# Patient Record
Sex: Female | Born: 2005
Health system: Southern US, Community
[De-identification: ages and names within clinical notes are randomized; demographics above are authoritative.]

## PROBLEM LIST (undated history)

## (undated) DIAGNOSIS — F909 Attention-deficit hyperactivity disorder, unspecified type: Secondary | ICD-10-CM

## (undated) DIAGNOSIS — S62511A Displaced fracture of proximal phalanx of right thumb, initial encounter for closed fracture: Secondary | ICD-10-CM

## (undated) DIAGNOSIS — T7840XA Allergy, unspecified, initial encounter: Secondary | ICD-10-CM

## (undated) HISTORY — DX: Allergy, unspecified, initial encounter: T78.40XA

## (undated) HISTORY — DX: Displaced fracture of proximal phalanx of right thumb, initial encounter for closed fracture: S62.511A

## (undated) HISTORY — PX: WISDOM TOOTH EXTRACTION: SHX21

---
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
## 2024-02-22 ENCOUNTER — Ambulatory Visit (INDEPENDENT_AMBULATORY_CARE_PROVIDER_SITE_OTHER): Payer: Medicaid Other | Admitting: Family Medicine

## 2024-02-22 VITALS — BP 118/76 | Ht 64.0 in | Wt 179.0 lb

## 2024-02-22 DIAGNOSIS — M24412 Recurrent dislocation, left shoulder: Secondary | ICD-10-CM | POA: Diagnosis not present

## 2024-02-23 ENCOUNTER — Ambulatory Visit: Payer: BC Managed Care – PPO | Admitting: Family Medicine

## 2024-02-24 ENCOUNTER — Encounter: Payer: Self-pay | Admitting: Family Medicine

## 2024-02-24 NOTE — Progress Notes (Signed)
 PCP: Theadore Nan, MD  Chief Complaint: Left shoulder pain Subjective:   HPI: Patient is a 18 y.o. female here for for follow-up of her left shoulder pain.  Patient states that a few years ago she had a possible dislocation of her left shoulder while playing basketball.  Patient was diagnosed with a subluxation of the left shoulder at that time.  Patient states that since then she has had multiple episodes of where she feels like her shoulder goes in and out of place.  Patient states most recently she was playing flag football and felt her shoulder pull and had severe pain.  Patient had to come out of the game and was placed in a sling by a trainer on the side line.  Patient notes that the other day she woke up in the morning and placed her left hand behind her back while she was getting out of bed and also felt like her shoulder slipped. Patient is currently using a sling and notes significant pain with any movement.  Patient has been doing Tylenol for pain.  Past Medical History:  Diagnosis Date   ADHD (attention deficit hyperactivity disorder)    Allergy    Phreesia 05/08/2021   Closed fracture of base of proximal phalanx of right thumb 06/04/2015   Salter-Harris type II Ortho: Dairl Ponder [facility])    Nocturnal enuresis 03/13/2014   Shoulder subluxation, left, initial encounter 01/12/2022   Urinary urgency 08/14/2015    Current Outpatient Medications on File Prior to Visit  Medication Sig Dispense Refill   buPROPion (WELLBUTRIN XL) 300 MG 24 hr tablet Take 300 mg by mouth at bedtime. (Patient not taking: Reported on 12/09/2023)     cetirizine (ZYRTEC) 10 MG tablet Take 1 tablet (10 mg total) by mouth daily. 30 tablet 11   fluticasone (FLONASE) 50 MCG/ACT nasal spray Place 1 spray into both nostrils daily as needed for allergies or rhinitis. 16 mL 3   guanFACINE (INTUNIV) 4 MG TB24 ER tablet Take 4 mg by mouth every morning.     hydrocortisone 2.5 % cream Mixed 1:1 with  Eucerin Cream by pharmacy. Use daily PRN dry skin. 424 g 5   hydrOXYzine (ATARAX) 50 MG tablet Take 50 mg by mouth daily as needed.     polyethylene glycol powder (GLYCOLAX/MIRALAX) 17 GM/SCOOP powder Take one capful in 8 ounces once a day as needed 225 g 5   RETIN-A 0.01 % gel Apply topically at bedtime. Apply moisturizing cream afterwards. Use sunscreen while using this product. (Patient not taking: Reported on 12/09/2023) 45 g 5   REXULTI 3 MG TABS Take 1 tablet by mouth daily. (Patient not taking: Reported on 12/09/2023)     No current facility-administered medications on file prior to visit.    No past surgical history on file.  Allergies  Allergen Reactions   Other Itching    Seasonal     BP 118/76   Ht 5\' 4"  (1.626 m)   Wt 179 lb (81.2 kg)   BMI 30.73 kg/m       No data to display             01/12/2022    3:41 PM 02/18/2022    8:36 AM  [facility]olescent [facility]  Frequency of at least 60 minutes physical activity (# days/week) 5 5        Objective:  Physical Exam:  Gen: NAD, comfortable in exam room  Left shoulder: Inspection reveals no gross  abnormality of the left shoulder, there is tenderness to palpation throughout the shoulder over the humeral head on the anterior lateral and posterior aspect.  There is also tenderness down the biceps.  Range of motion is decreased due to significant amount of pain, noted instability with shoulder abduction between 20 to 40 degrees.  Did not attempt any further due to pain.  Strength is decreased significantly of the left shoulder.   Assessment & Plan:  1. 1. Recurrent subluxation of left shoulder (Primary) -Discussed with patient that given her symptoms as well as her physical exam and history, we will go ahead and order an MRI of the shoulder right now for shoulder instability concerning for labral tear.  Patient may be having recurrent subluxations due to the tear.  Will go ahead and order MRI. -In  the meantime, patient was advised to only use the sling if she plans on walking long distances otherwise would prefer her to be out of the sling while at home.  Patient is accompanied by her mother who states that she is home for the week and will be resting.  Follow-up pending MRI. - MR SHOULDER LEFT W CONTRAST; Future - DG FLUORO GUIDED NEEDLE PLC ASPIRATION/INJECTION LOC; Future    Brenton Grills MD, PGY-4  Sports Medicine Fellow [facility]

## 2024-03-13 ENCOUNTER — Ambulatory Visit
Admission: RE | Admit: 2024-03-13 | Discharge: 2024-03-13 | Disposition: A | Discharge status: Home / Self Care | Payer: Medicaid Other | Source: Ambulatory Visit | Attending: Family Medicine

## 2024-03-13 ENCOUNTER — Ambulatory Visit
Admission: RE | Admit: 2024-03-13 | Discharge: 2024-03-13 | Disposition: A | Discharge status: Home / Self Care | Payer: Medicaid Other | Source: Ambulatory Visit | Attending: Family Medicine | Admitting: Family Medicine

## 2024-03-13 DIAGNOSIS — M24412 Recurrent dislocation, left shoulder: Secondary | ICD-10-CM

## 2024-03-13 MED ORDER — IOPAMIDOL (ISOVUE-M 200) INJECTION 41%
10.0000 mL | Freq: Once | INTRAMUSCULAR | Status: AC
Start: 2024-03-13 — End: 2024-03-13
  Administered 2024-03-13: 10 mL via INTRA_ARTICULAR

## 2024-03-20 NOTE — Addendum Note (Signed)
 Addended by: Brenton Grills on: 03/20/2024 01:59 PM   Modules accepted: Orders

## 2024-03-31 ENCOUNTER — Telehealth (HOSPITAL_BASED_OUTPATIENT_CLINIC_OR_DEPARTMENT_OTHER): Payer: Self-pay | Admitting: Orthopaedic Surgery

## 2024-03-31 ENCOUNTER — Ambulatory Visit (HOSPITAL_BASED_OUTPATIENT_CLINIC_OR_DEPARTMENT_OTHER): Payer: Self-pay | Admitting: Orthopaedic Surgery

## 2024-03-31 ENCOUNTER — Other Ambulatory Visit (HOSPITAL_BASED_OUTPATIENT_CLINIC_OR_DEPARTMENT_OTHER): Payer: Self-pay | Admitting: Orthopaedic Surgery

## 2024-03-31 ENCOUNTER — Ambulatory Visit (HOSPITAL_BASED_OUTPATIENT_CLINIC_OR_DEPARTMENT_OTHER): Admitting: Orthopaedic Surgery

## 2024-03-31 DIAGNOSIS — M25312 Other instability, left shoulder: Secondary | ICD-10-CM | POA: Diagnosis not present

## 2024-03-31 NOTE — Progress Notes (Signed)
 Chief Complaint: Left shoulder instability     History of Present Illness:    Marissa [name] is a 18 y.o. female left-hand-dominant female presents with left shoulder pain after approximately 10 dislocations.  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  She is here today for discussion of her recurrent shoulder instability.  She has trialed bracing in the past.  She has tried physical therapy for strengthening but despite this has had recurrent episodes.  She has not had approximately 10 episodes of either location or subluxation.  She has never required a formal reduction.  She is here today for MRI discussion.    PMH/PSH/Family History/Social History/Meds/Allergies:    Past Medical History:  Diagnosis Date   ADHD (attention deficit hyperactivity disorder)    Allergy    Phreesia 05/08/2021   Closed fracture of base of proximal phalanx of right thumb 06/04/2015   Salter-Harris type II Ortho: Dairl Ponder [facility])    Nocturnal enuresis 03/13/2014   Shoulder subluxation, left, initial encounter 01/12/2022   Urinary urgency 08/14/2015   No past surgical history on file. Social History   Socioeconomic History   Marital status: Single    Spouse name: Not on file   Number of children: Not on file   Years of education: Not on file   Highest education level: Not on file  Occupational History   Not on file  Tobacco Use   Smoking status: Never    Passive exposure: Never   Smokeless tobacco: Never  Substance and Sexual Activity   Alcohol use: Not on file   Drug use: Not on file   Sexual activity: Not on file  Other Topics Concern   Not on file  Social History Narrative   11th grade [employer] 22-23 school year.    Adopted at a young age   Social Drivers of Corporate investment banker Strain: Not on file  Food Insecurity: No Food Insecurity (08/17/2022)   Hunger Vital Sign    Worried About Running Out of Food in the Last Year: Never  true    Ran Out of Food in the Last Year: Never true  Transportation Needs: Not on file  Physical Activity: Not on file  Stress: Not on file  Social Connections: Not on file   No family history on file. Allergies  Allergen Reactions   Other Itching    Seasonal    Current Outpatient Medications  Medication Sig Dispense Refill   buPROPion (WELLBUTRIN XL) 300 MG 24 hr tablet Take 300 mg by mouth at bedtime. (Patient not taking: Reported on 12/09/2023)     cetirizine (ZYRTEC) 10 MG tablet Take 1 tablet (10 mg total) by mouth daily. 30 tablet 11   fluticasone (FLONASE) 50 MCG/ACT nasal spray Place 1 spray into both nostrils daily as needed for allergies or rhinitis. 16 mL 3   guanFACINE (INTUNIV) 4 MG TB24 ER tablet Take 4 mg by mouth every morning.     hydrocortisone 2.5 % cream Mixed 1:1 with Eucerin Cream by pharmacy. Use daily PRN dry skin. 424 g 5   hydrOXYzine (ATARAX) 50 MG tablet Take 50 mg by mouth daily as needed.     polyethylene glycol powder (GLYCOLAX/MIRALAX) 17 GM/SCOOP powder Take one capful in 8 ounces once a day as needed 225 g 5   RETIN-A 0.01 % gel Apply topically at bedtime. Apply moisturizing cream afterwards. Use sunscreen while using this product. (Patient not taking: Reported on  12/09/2023) 45 g 5   REXULTI 3 MG TABS Take 1 tablet by mouth daily. (Patient not taking: Reported on 12/09/2023)     No current facility-administered medications for this visit.   No results found.  Review of Systems:   A ROS was performed including pertinent positives and negatives as documented in the HPI.  Physical Exam :   Constitutional: NAD and appears stated age Neurological: Alert and oriented Psych: Appropriate affect and cooperative There were no vitals taken for this visit.   Comprehensive Musculoskeletal Exam:    Left shoulder with positive anterior apprehension, 2+ anterior load-and-shift.  Range of motion is 270 degrees bilaterally.  Internal rotation is to L1  bilaterally external rotation at the side to 90 degrees is 80 degrees with internal rotation to 70 degrees.  Distal neurosensory exam is intact   Imaging:   Xray (3 views left shoulder): Hill Sachs lesion  MRI (left shoulder): With left shoulder anterior-inferior labral tear with a small degree of glenoid bone loss anteriorly and a Hill-Sachs lesion with an Ontrack lesion   I personally reviewed and interpreted the radiographs.   Assessment and Plan:   18 y.o. 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  I did describe that her lesion is on track and given that I would recommend labral repair with a remplissage depending on the size of her Hill-Sachs lesion at the time of arthroscopy.  I did discuss the risk limitations.  At this time she has trialed conservative management as well as physical therapy and bracing without any relief.  She has had 10 episodes of subluxation and given this I did describe that I would recommend surgical treatment in order to prevent any future instability.  -Lan for left shoulder arthroscopy with labral repair and remplissage   After a lengthy discussion of treatment options, including risks, benefits, alternatives, complications of surgical and nonsurgical conservative options, the patient elected surgical repair.   The patient  is aware of the material risks  and complications including, but not limited to injury to adjacent structures, neurovascular injury, infection, numbness, bleeding, implant failure, thermal burns, stiffness, persistent pain, failure to heal, disease transmission from allograft, need for further surgery, dislocation, anesthetic risks, blood clots, risks of death,and others. The probabilities of surgical success and failure discussed with patient given their particular co-morbidities.The time and nature of expected rehabilitation and recovery was discussed.The patient's questions were  all answered preoperatively.  No barriers to understanding were noted. I explained the natural history of the disease process and Rx rationale.  I explained to the patient what I considered to be reasonable expectations given their personal situation.  The final treatment plan was arrived at through a shared patient decision making process model.    I personally saw and evaluated the patient, and participated in the management and treatment plan.  Huel Cote, MD Attending Physician, Orthopedic Surgery  This document was dictated using Dragon voice recognition software. A reasonable attempt at proof reading has been made to minimize errors.

## 2024-03-31 NOTE — Telephone Encounter (Signed)
Surgery info

## 2024-05-01 ENCOUNTER — Ambulatory Visit (INDEPENDENT_AMBULATORY_CARE_PROVIDER_SITE_OTHER)

## 2024-05-01 VITALS — BP 116/72 | Ht 64.37 in | Wt 162.6 lb

## 2024-05-01 DIAGNOSIS — Z113 Encounter for screening for infections with a predominantly sexual mode of transmission: Secondary | ICD-10-CM

## 2024-05-01 DIAGNOSIS — F4310 Post-traumatic stress disorder, unspecified: Secondary | ICD-10-CM | POA: Diagnosis not present

## 2024-05-01 DIAGNOSIS — Z114 Encounter for screening for human immunodeficiency virus [HIV]: Secondary | ICD-10-CM | POA: Diagnosis not present

## 2024-05-01 DIAGNOSIS — L2082 Flexural eczema: Secondary | ICD-10-CM | POA: Diagnosis not present

## 2024-05-01 DIAGNOSIS — Z7189 Other specified counseling: Secondary | ICD-10-CM | POA: Diagnosis not present

## 2024-05-01 DIAGNOSIS — B001 Herpesviral vesicular dermatitis: Secondary | ICD-10-CM

## 2024-05-01 DIAGNOSIS — Z1339 Encounter for screening examination for other mental health and behavioral disorders: Secondary | ICD-10-CM

## 2024-05-01 DIAGNOSIS — Z1331 Encounter for screening for depression: Secondary | ICD-10-CM

## 2024-05-01 DIAGNOSIS — Z0001 Encounter for general adult medical examination with abnormal findings: Secondary | ICD-10-CM

## 2024-05-01 DIAGNOSIS — Z68.41 Body mass index (BMI) pediatric, 85th percentile to less than 95th percentile for age: Secondary | ICD-10-CM

## 2024-05-01 DIAGNOSIS — J301 Allergic rhinitis due to pollen: Secondary | ICD-10-CM

## 2024-05-01 DIAGNOSIS — Z0000 Encounter for general adult medical examination without abnormal findings: Secondary | ICD-10-CM

## 2024-05-01 LAB — POCT RAPID HIV: Rapid HIV, POC: NEGATIVE

## 2024-05-01 MED ORDER — CETIRIZINE HCL 10 MG PO TABS: 10.0000 mg | ORAL_TABLET | Freq: Every day | ORAL | 11 refills | Status: AC

## 2024-05-01 MED ORDER — EUCRISA 2 % EX OINT: 60.0000 g | TOPICAL_OINTMENT | CUTANEOUS | 0 refills | Status: AC | PRN

## 2024-05-01 MED ORDER — FLUTICASONE PROPIONATE 50 MCG/ACT NA SUSP: 1.0000 | Freq: Every day | NASAL | 3 refills | Status: AC | PRN

## 2024-05-01 MED ORDER — TRIAMCINOLONE ACETONIDE 0.5 % EX OINT: TOPICAL_OINTMENT | Freq: Two times a day (BID) | CUTANEOUS | 0 refills | Status: AC

## 2024-05-01 MED ORDER — ACYCLOVIR 5 % EX OINT: 1.0000 | TOPICAL_OINTMENT | CUTANEOUS | 1 refills | Status: AC

## 2024-05-01 NOTE — Patient Instructions (Addendum)
 I prescribed a medicine called Eucrisa to use on eyelids or face for eczema/itching.  It may not be covered by insurance.  Adult Primary Care Clinics Name Criteria Services   [facility]  Address: [address] [place], [place] [address]  Phone: [phone] Hours: Monday - Friday 9 AM -6 PM  Types of insurance accepted:  Commercial insurance [employer] (orange card) [employer] Uninsured  Language services:  Video and phone interpreters available   Ages 75 and older    Adult primary care Onsite pharmacy Integrated behavioral health Financial assistance counseling Walk-in hours for established patients  Financial assistance counseling hours: Tuesdays 2:00PM - 5:00PM  Thursday 8:30AM - 4:30PM  Space is limited, 10 on Tuesday and 20 on Thursday. It's on first come first serve basis  Name Criteria Services   [facility]  Address: [address] [place], [place] [address]  Phone: [phone]  Hours: Monday - Friday 8:30 AM - 5 PM  Types of insurance accepted:  Commercial insurance Medicaid Medicare Uninsured  Language services:  Video and phone interpreters available   All ages - newborn to adult   Primary care for all ages (children and adults) Integrated behavioral health Nutritionist Financial assistance counseling   Name Criteria Services   [facility]  Located on the ground floor of [facility]  Address: [address]  [place],  [place]  [address]  Phone: [phone]  Hours: Monday - Friday 8:15 AM - 5 PM  Types of insurance accepted:  Commercial insurance Medicaid Medicare Uninsured  Language services:  Video and phone interpreters available   Ages 34 and older   Adult primary care Nutritionist Certified Diabetes Educator  Integrated behavioral health Financial assistance counseling   Name Criteria Services    [facility]  Address: [address] [place], [place] [address]  Phone: [phone]  Hours: Monday - Friday 8:30 AM - 5 PM    Types of insurance accepted:  Nurse, learning disability Medicaid Medicare Uninsured  Language services:  Video and phone interpreters available   All ages - newborn to adult   Primary care for all ages (children and adults) Integrated behavioral health Financial assistance counseling

## 2024-05-01 NOTE — Progress Notes (Addendum)
 Adolescent Well Care Visit Marissa [name] is a 18 y.o. female who is here for well care.     PCP:  Lavonda Pour, MD   History was provided by the patient and mother.  Confidentiality was discussed with the patient and, if applicable, with caregiver as well. Patient's personal or confidential phone number: [phone]  Last well-visit: 08/17/2022 (18 yo well care) -allergic rhinitis: zyrtec  and flonase  -snoring: flonase  -flexural eczema: Eucerin and hydrocortisone  mixed -constipation: miralax  -acne: retin-A  -panic attacks/PTSD: follows with psych (Dr. Rochelle Chu), meds - Rexulti, guanfacine, wellbutrin   Interval history: -12/09/23 - vaginitis -02/20/23 - recurrent subluxation of L shoulder, ordered MRI -03/31/24 - ortho visit, labral tear as well as a Hill-Sachs lesion, planning for elective surgical repair (scheduled for 05/30/24)  Current Issues: Current concerns include:  1.Cold sores: more prevalent a couple days before her period starts, has them frequently (several times per year).  Requests treatment.  2.Flexural eczema on legs and arms and would like med refill.  Also wants treatment for eczema on eyelid area.  3.Wants refill on allergy meds.  4.She stopped taking mental health meds several months ago.  She felt like she didn't need them anymore.  However, she stopped taking them abruptly.  Previously on Rexulti, guanfacine, and wellbutrin .  Psychiatrist took her off wellbutrin  but she was supposed to continue taking Rexulti and guanfacine.  Previous child psychiatrist not able to see her anymore since she is over 49.  She saw another psychiatrist virtually while away at college.    Mood: -stressed over normal stuff -mom has noticed "flightiness" and impulsivity -easily overstimulated -can't remember when last panic attack was -anxiety - doesn't like being on the phone -experiences anxiety symptoms every day  Nutrition: Nutrition/Eating Behaviors:  -at school,  doesn't eat 3 meals regularly -eats well-balanced at home Adequate calcium in diet?: multiple servings at home (less at school) Supplements/ Vitamins: women's Alive multivitamin  Exercise/ Media: Play any Sports?:  did play flag football in college but recently had L shoulder injury (scheduled for surgery in June) Exercise: less active now due to injury Screen Time:  < 2 hours  Sleep:  Sleep: 8-10 hours of sleep, feels well-rested during the day, no problems falling asleep or staying asleep  Social Screening: Lives with: in dorm at school, during summer - lives with two moms and 3 younger siblings Concerns regarding behavior with peers?  No Behavior at home: mom reports issues with defiance and respect, ran away over Thanksgiving break (gone for a week) - states she went to stay with a friend and then went back to campus after.  Mom states she has issues with control and anger management. No job currently for over the summer.  Education: School Name: [employer] in college now. School performance: grades are "not the best," can get overstimulated during classes School Behavior: doing well; no concerns  Menstruation:   Menstrual History:  -occur once per month -LMP: one week ago -last 4-5 days -cramps have been worse recently with heavier flow -discussed birth control but worried about weight gain, wants to hold off for now  Patient has a dental home: yes  Confidential social history: Tobacco?  no Secondhand smoke exposure?  Yes - from peers Drugs/ETOH?  no  Sexually Active?  Yes, with one female partner  Safe at home, in school & in relationships?  Yes Safe to self?  Yes  -feels down/depressed multiple times per week -no thoughts of hurting herself or SI -feels like she  has a good support system Environmental manager)  Screenings:  The patient completed the Rapid Assessment for Adolescent Preventive Services screening questionnaire. The following topics were discussed as  part of anticipatory guidance healthy eating, exercise, mental health issues, school problems, and family problems.  PHQ-9 completed and results indicated score of 3.  Flowsheet Row Office Visit from 05/01/2024 in [place] [facility]  PHQ-2 Total Score 1      Physical Exam:  Vitals:   05/01/24 1553  BP: 116/72  Weight: 162 lb 9.6 oz (73.8 kg)  Height: 5' 4.37" (1.635 m)   BP 116/72 (BP Location: Left Arm, Patient Position: Sitting, Cuff Size: Normal)   Ht 5' 4.37" (1.635 m)   Wt 162 lb 9.6 oz (73.8 kg)   BMI 27.59 kg/m  Body mass index: body mass index is 27.59 kg/m. Blood pressure %iles are not available for patients who are 18 years or older.  Hearing Screening  Method: Audiometry   500Hz  1000Hz  2000Hz  4000Hz   Right ear 25 25 20 25   Left ear 25 25 20 25    Vision Screening   Right eye Left eye Both eyes  Without correction 20/16 20/16 20/16   With correction       General: Awake, alert, appropriately responsive in no acute distress HEENT: Normocephalic, atraumatic. EOMI, PERRL, clear sclera and conjunctiva. Clear nares bilaterally. Oropharynx clear with no tonsillar enlargment or exudates. Moist mucous membranes. Neck: Supple.  Lymph Nodes: No palpable lymphadenopathy.  CV: RRR, normal S1, S2. No murmur appreciated. 2+ distal pulses.  Pulm: Normal WOB. CTAB with good aeration throughout.  No focal wheezing/crackles. Abd: Normoactive bowel sounds. Soft, non-tender, non-distended.  GU: Declined (trauma history) MSK: Extremities WWP. Moves all extremities equally.  Neuro: Appropriately responsive to stimuli. Normal bulk and tone. No gross deficits appreciated.  Skin: Flexural eczema with excoriated lesions present on elbows and behind knees.  Hyperpigmented areas in axillary region.  Cap refill < 2 seconds.   Results for orders placed or performed in visit on 05/01/24 (from the past 48 hours)  POCT Rapid HIV     Status: None    Collection Time: 05/01/24  4:19 PM  Result Value Ref Range   Rapid HIV, POC Negative     Assessment and Plan:   1. Encounter for general adult medical examination with abnormal findings (Primary) BMI is not appropriate for age (see below) Hearing screening result:normal Vision screening result: normal  2. Body mass index, pediatric, 85th percentile to less than 95th percentile for age - Suspect elevated BMI is due to increased muscle mass - Shoulder surgery scheduled for June.  Advised to increase activity as able once healed from surgery - Discussed healthy lifestyle choices, which family continues to make Counseled regarding 5-2-1-0 goals of healthy active living including:  eating at least 5 fruits and vegetables a day at least 1 hour of activity no sugary beverages eating three meals each day with age-appropriate servings age-appropriate screen time age-appropriate sleep patterns  3. Screening for human immunodeficiency virus Results negative. - POCT Rapid HIV  4. Screening examination for venereal disease Results pending. - C. trachomatis/N. gonorrhoeae RNA  5. Flexural eczema Reinforced daily skin care with unscented products and daily emollient use.  Prescribed triamcinolone  for flexural eczema on arms and legs.  Prescribed Eucrisa for eyelids and face.  Counseled on use. - triamcinolone  ointment (KENALOG ) 0.5 %; Apply topically 2 (two) times daily. Apply to eczema on body.  Not to exceed  14 days.  Dispense: 30 g; Refill: 0 - Crisaborole (EUCRISA) 2 % OINT; Apply 60 g topically as needed. Apply to eyelids for itching/eczema on face as needed.  Max 14 days.  Dispense: 60 g; Refill: 0  6. Non-seasonal allergic rhinitis due to pollen Refills sent for zyrtec  and flonase . - cetirizine  (ZYRTEC ) 10 MG tablet; Take 1 tablet (10 mg total) by mouth daily.  Dispense: 30 tablet; Refill: 11 - fluticasone  (FLONASE ) 50 MCG/ACT nasal spray; Place 1 spray into both nostrils daily as  needed for allergies or rhinitis.  Dispense: 16 mL; Refill: 3  7. 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  Prescription sent for topical acyclovir  and counseled on use. - acyclovir  ointment (ZOVIRAX ) 5 %; Apply 1 Application topically every 3 (three) hours.  Dispense: 15 g; Refill: 1  8. PTSD (post-traumatic stress disorder) Patient does not remember when her last panic attack was.  She reports feeling anxious every day and feeling down/depressed multiple times per week.  No current SI or thoughts of hurting herself.  History of being on Rexulti, guanfacine, and wellbutrin .  Patient self-discontinued these medications several months ago.  These medications were previously prescribed by a child psychiatrist.  She is interested in establishing with an adult provider.  Referral placed. - Ambulatory referral to Psychiatry  9. Other specified counseling - adolescent transition Discussed that it is appropriate for patient to establish care with an adult provider.  Mom states that she has a family medicine provider and that she has an appointment herself next week.  She will work on getting patient established.    Follow-up: PRN  Thurmond Floro, MD [facility]

## 2024-05-02 ENCOUNTER — Other Ambulatory Visit: Payer: Self-pay

## 2024-05-02 DIAGNOSIS — L2082 Flexural eczema: Secondary | ICD-10-CM

## 2024-05-02 LAB — C. TRACHOMATIS/N. GONORRHOEAE RNA
C. trachomatis RNA, TMA: NOT DETECTED
N. gonorrhoeae RNA, TMA: NOT DETECTED

## 2024-05-02 NOTE — Telephone Encounter (Signed)
 Prior Authorization Form completed.  Could you please fax and let patient know?

## 2024-05-02 NOTE — Telephone Encounter (Signed)
 Faxed form to appropriate designation, left Vm to patient to call back on nurse line.

## 2024-05-24 ENCOUNTER — Encounter (HOSPITAL_BASED_OUTPATIENT_CLINIC_OR_DEPARTMENT_OTHER): Payer: Self-pay | Admitting: Orthopaedic Surgery

## 2024-05-24 ENCOUNTER — Other Ambulatory Visit: Payer: Self-pay

## 2024-05-30 ENCOUNTER — Encounter (HOSPITAL_BASED_OUTPATIENT_CLINIC_OR_DEPARTMENT_OTHER)
Admission: RE | Disposition: A | Discharge status: Home / Self Care | Payer: Self-pay | Source: Home / Self Care | Attending: Orthopaedic Surgery

## 2024-05-30 ENCOUNTER — Other Ambulatory Visit: Payer: Self-pay

## 2024-05-30 ENCOUNTER — Ambulatory Visit (HOSPITAL_BASED_OUTPATIENT_CLINIC_OR_DEPARTMENT_OTHER): Admitting: Anesthesiology

## 2024-05-30 ENCOUNTER — Ambulatory Visit (HOSPITAL_BASED_OUTPATIENT_CLINIC_OR_DEPARTMENT_OTHER)
Admission: RE | Admit: 2024-05-30 | Discharge: 2024-05-30 | Disposition: A | Discharge status: Home / Self Care | Attending: Orthopaedic Surgery | Admitting: Orthopaedic Surgery

## 2024-05-30 ENCOUNTER — Encounter (HOSPITAL_BASED_OUTPATIENT_CLINIC_OR_DEPARTMENT_OTHER): Payer: Self-pay | Admitting: Orthopaedic Surgery

## 2024-05-30 DIAGNOSIS — Z01818 Encounter for other preprocedural examination: Secondary | ICD-10-CM

## 2024-05-30 DIAGNOSIS — S42292A Other displaced fracture of upper end of left humerus, initial encounter for closed fracture: Secondary | ICD-10-CM | POA: Diagnosis not present

## 2024-05-30 DIAGNOSIS — Z79899 Other long term (current) drug therapy: Secondary | ICD-10-CM | POA: Diagnosis not present

## 2024-05-30 DIAGNOSIS — X58XXXA Exposure to other specified factors, initial encounter: Secondary | ICD-10-CM | POA: Diagnosis not present

## 2024-05-30 DIAGNOSIS — M25312 Other instability, left shoulder: Secondary | ICD-10-CM | POA: Diagnosis not present

## 2024-05-30 DIAGNOSIS — F419 Anxiety disorder, unspecified: Secondary | ICD-10-CM | POA: Insufficient documentation

## 2024-05-30 HISTORY — PX: SHOULDER ARTHROSCOPY WITH LABRAL REPAIR: SHX5691

## 2024-05-30 LAB — POCT PREGNANCY, URINE: Preg Test, Ur: NEGATIVE

## 2024-05-30 SURGERY — ARTHROSCOPY, SHOULDER, WITH GLENOID LABRUM REPAIR
Anesthesia: General | Site: Shoulder | Laterality: Left

## 2024-05-30 MED ORDER — OXYCODONE HCL 5 MG PO TABS: 5.0000 mg | ORAL_TABLET | Freq: Once | ORAL | Status: DC | PRN

## 2024-05-30 MED ORDER — MIDAZOLAM HCL 2 MG/2ML IJ SOLN
INTRAMUSCULAR | Status: AC
Start: 2024-05-30 — End: ?
  Filled 2024-05-30: qty 2

## 2024-05-30 MED ORDER — MEPERIDINE HCL 25 MG/ML IJ SOLN: 6.2500 mg | INTRAMUSCULAR | Status: DC | PRN

## 2024-05-30 MED ORDER — LACTATED RINGERS IV SOLN: INTRAVENOUS | Status: DC

## 2024-05-30 MED ORDER — CEFAZOLIN SODIUM-DEXTROSE 2-4 GM/100ML-% IV SOLN
INTRAVENOUS | Status: AC
  Filled 2024-05-30: qty 100

## 2024-05-30 MED ORDER — FENTANYL CITRATE (PF) 100 MCG/2ML IJ SOLN
100.0000 ug | Freq: Once | INTRAMUSCULAR | Status: AC
  Administered 2024-05-30: 100 ug via INTRAVENOUS

## 2024-05-30 MED ORDER — SUGAMMADEX SODIUM 200 MG/2ML IV SOLN
INTRAVENOUS | Status: DC | PRN
  Administered 2024-05-30: 200 mg via INTRAVENOUS

## 2024-05-30 MED ORDER — PROPOFOL 10 MG/ML IV BOLUS
INTRAVENOUS | Status: DC | PRN
Start: 2024-05-30 — End: 2024-05-30
  Administered 2024-05-30: 200 mg via INTRAVENOUS

## 2024-05-30 MED ORDER — OXYCODONE HCL 5 MG/5ML PO SOLN: 5.0000 mg | Freq: Once | ORAL | Status: DC | PRN

## 2024-05-30 MED ORDER — ACETAMINOPHEN 500 MG PO TABS
1000.0000 mg | ORAL_TABLET | Freq: Once | ORAL | Status: AC
  Administered 2024-05-30: 1000 mg via ORAL

## 2024-05-30 MED ORDER — KETOROLAC TROMETHAMINE 30 MG/ML IJ SOLN
INTRAMUSCULAR | Status: DC | PRN
  Administered 2024-05-30: 30 mg via INTRAVENOUS

## 2024-05-30 MED ORDER — CEFAZOLIN SODIUM-DEXTROSE 2-4 GM/100ML-% IV SOLN
2.0000 g | INTRAVENOUS | Status: AC
  Administered 2024-05-30: 2 g via INTRAVENOUS

## 2024-05-30 MED ORDER — DEXAMETHASONE SODIUM PHOSPHATE 10 MG/ML IJ SOLN
INTRAMUSCULAR | Status: DC | PRN
  Administered 2024-05-30: 8 mg via INTRAVENOUS

## 2024-05-30 MED ORDER — MIDAZOLAM HCL 2 MG/2ML IJ SOLN
4.0000 mg | Freq: Once | INTRAMUSCULAR | Status: AC
  Administered 2024-05-30: 4 mg via INTRAVENOUS

## 2024-05-30 MED ORDER — TRANEXAMIC ACID-NACL 1000-0.7 MG/100ML-% IV SOLN
INTRAVENOUS | Status: AC
  Filled 2024-05-30: qty 100

## 2024-05-30 MED ORDER — ACETAMINOPHEN 500 MG PO TABS: 1000.0000 mg | ORAL_TABLET | Freq: Once | ORAL | Status: DC

## 2024-05-30 MED ORDER — ONDANSETRON HCL 4 MG/2ML IJ SOLN
INTRAMUSCULAR | Status: DC | PRN
  Administered 2024-05-30: 4 mg via INTRAVENOUS

## 2024-05-30 MED ORDER — FENTANYL CITRATE (PF) 100 MCG/2ML IJ SOLN: 25.0000 ug | INTRAMUSCULAR | Status: DC | PRN

## 2024-05-30 MED ORDER — GLYCOPYRROLATE PF 0.2 MG/ML IJ SOSY
PREFILLED_SYRINGE | INTRAMUSCULAR | Status: DC | PRN
  Administered 2024-05-30: .2 mg via INTRAVENOUS

## 2024-05-30 MED ORDER — GLYCOPYRROLATE PF 0.2 MG/ML IJ SOSY
PREFILLED_SYRINGE | INTRAMUSCULAR | Status: AC
  Filled 2024-05-30: qty 1

## 2024-05-30 MED ORDER — ONDANSETRON HCL 4 MG/2ML IJ SOLN
INTRAMUSCULAR | Status: AC
  Filled 2024-05-30: qty 2

## 2024-05-30 MED ORDER — OXYCODONE HCL 5 MG PO TABS: 5.0000 mg | ORAL_TABLET | ORAL | 0 refills | Status: AC | PRN

## 2024-05-30 MED ORDER — FENTANYL CITRATE (PF) 100 MCG/2ML IJ SOLN
INTRAMUSCULAR | Status: DC | PRN
  Administered 2024-05-30: 50 ug via INTRAVENOUS

## 2024-05-30 MED ORDER — ACETAMINOPHEN 500 MG PO TABS
ORAL_TABLET | ORAL | Status: AC
Start: 2024-05-30 — End: ?
  Filled 2024-05-30: qty 2

## 2024-05-30 MED ORDER — ROCURONIUM 10MG/ML (10ML) SYRINGE FOR MEDFUSION PUMP - OPTIME
INTRAVENOUS | Status: DC | PRN
  Administered 2024-05-30: 60 mg via INTRAVENOUS

## 2024-05-30 MED ORDER — ACETAMINOPHEN 500 MG PO TABS: 500.0000 mg | ORAL_TABLET | Freq: Three times a day (TID) | ORAL | 0 refills | Status: AC

## 2024-05-30 MED ORDER — LACTATED RINGERS IV SOLN: INTRAVENOUS | Status: DC | PRN

## 2024-05-30 MED ORDER — ROCURONIUM BROMIDE 10 MG/ML (PF) SYRINGE
PREFILLED_SYRINGE | INTRAVENOUS | Status: AC
  Filled 2024-05-30: qty 10

## 2024-05-30 MED ORDER — LIDOCAINE 2% (20 MG/ML) 5 ML SYRINGE
INTRAMUSCULAR | Status: DC | PRN
Start: 2024-05-30 — End: 2024-05-30
  Administered 2024-05-30: 60 mg via INTRAVENOUS

## 2024-05-30 MED ORDER — LIDOCAINE 2% (20 MG/ML) 5 ML SYRINGE
INTRAMUSCULAR | Status: AC
  Filled 2024-05-30: qty 5

## 2024-05-30 MED ORDER — MIDAZOLAM HCL 2 MG/2ML IJ SOLN
INTRAMUSCULAR | Status: AC
  Filled 2024-05-30: qty 2

## 2024-05-30 MED ORDER — SODIUM CHLORIDE 0.9 % IR SOLN
Status: DC | PRN
  Administered 2024-05-30 (×2): 3000 mL

## 2024-05-30 MED ORDER — FENTANYL CITRATE (PF) 100 MCG/2ML IJ SOLN
INTRAMUSCULAR | Status: AC
  Filled 2024-05-30: qty 2

## 2024-05-30 MED ORDER — PHENYLEPHRINE 80 MCG/ML (10ML) SYRINGE FOR IV PUSH (FOR BLOOD PRESSURE SUPPORT)
PREFILLED_SYRINGE | INTRAVENOUS | Status: AC
  Filled 2024-05-30: qty 10

## 2024-05-30 MED ORDER — ASPIRIN 325 MG PO TBEC: 325.0000 mg | DELAYED_RELEASE_TABLET | Freq: Every day | ORAL | 0 refills | Status: AC

## 2024-05-30 MED ORDER — IBUPROFEN 800 MG PO TABS: 800.0000 mg | ORAL_TABLET | Freq: Three times a day (TID) | ORAL | 0 refills | Status: AC

## 2024-05-30 MED ORDER — DEXAMETHASONE SODIUM PHOSPHATE 10 MG/ML IJ SOLN
INTRAMUSCULAR | Status: AC
  Filled 2024-05-30: qty 1

## 2024-05-30 MED ORDER — TRANEXAMIC ACID-NACL 1000-0.7 MG/100ML-% IV SOLN
1000.0000 mg | INTRAVENOUS | Status: AC
  Administered 2024-05-30: 1000 mg via INTRAVENOUS

## 2024-05-30 MED ORDER — AMISULPRIDE (ANTIEMETIC) 5 MG/2ML IV SOLN: 10.0000 mg | Freq: Once | INTRAVENOUS | Status: DC | PRN

## 2024-05-30 MED ORDER — GABAPENTIN 300 MG PO CAPS
ORAL_CAPSULE | ORAL | Status: AC
  Filled 2024-05-30: qty 1

## 2024-05-30 MED ORDER — GABAPENTIN 300 MG PO CAPS
300.0000 mg | ORAL_CAPSULE | Freq: Once | ORAL | Status: AC
  Administered 2024-05-30: 300 mg via ORAL

## 2024-05-30 MED ORDER — KETOROLAC TROMETHAMINE 30 MG/ML IJ SOLN
INTRAMUSCULAR | Status: AC
  Filled 2024-05-30: qty 1

## 2024-05-30 MED ORDER — EPHEDRINE 5 MG/ML INJ
INTRAVENOUS | Status: AC
  Filled 2024-05-30: qty 5

## 2024-05-30 MED ORDER — PHENYLEPHRINE 80 MCG/ML (10ML) SYRINGE FOR IV PUSH (FOR BLOOD PRESSURE SUPPORT)
PREFILLED_SYRINGE | INTRAVENOUS | Status: DC | PRN
  Administered 2024-05-30: 80 ug via INTRAVENOUS
  Administered 2024-05-30: 40 ug via INTRAVENOUS
  Administered 2024-05-30: 80 ug via INTRAVENOUS
  Administered 2024-05-30 (×2): 40 ug via INTRAVENOUS
  Administered 2024-05-30: 80 ug via INTRAVENOUS

## 2024-05-30 SURGICAL SUPPLY — 54 items
ANCHOR SUT 1.8 FIBERTAK SB KL (Anchor) IMPLANT
ANCHOR SUT FBRTK 2.6 SP #5 (Anchor) IMPLANT
BLADE EXCALIBUR 4.0X13 (MISCELLANEOUS) IMPLANT
BLADE SHAVER TORPEDO 4X13 (MISCELLANEOUS) ×1 IMPLANT
BUR SURG 4D 13L RD FLUTE (BUR) IMPLANT
BURR OVAL 8 FLU 4.0X13 (MISCELLANEOUS) IMPLANT
CANNULA 5.75X71 LONG (CANNULA) IMPLANT
CANNULA 8.25X9 (CANNULA) IMPLANT
CANNULA PASSPORT 5 (CANNULA) IMPLANT
CANNULA PASSPORT BUTTON 10-40 (CANNULA) IMPLANT
CANNULA TWIST IN 8.25X7CM (CANNULA) IMPLANT
CHLORAPREP W/TINT 26 (MISCELLANEOUS) ×1 IMPLANT
COOLER ICEMAN CLASSIC (MISCELLANEOUS) ×1 IMPLANT
DRAPE IMP U-DRAPE 54X76 (DRAPES) ×1 IMPLANT
DRAPE INCISE IOBAN 66X45 STRL (DRAPES) ×1 IMPLANT
DRAPE SHOULDER BEACH CHAIR (DRAPES) ×1 IMPLANT
DRAPE U-SHAPE 47X51 STRL (DRAPES) ×2 IMPLANT
DRSG TEGADERM 4X4.75 (GAUZE/BANDAGES/DRESSINGS) IMPLANT
DW OUTFLOW CASSETTE/TUBE SET (MISCELLANEOUS) ×1 IMPLANT
GAUZE PAD ABD 8X10 STRL (GAUZE/BANDAGES/DRESSINGS) ×1 IMPLANT
GAUZE SPONGE 4X4 12PLY STRL (GAUZE/BANDAGES/DRESSINGS) ×1 IMPLANT
GAUZE XEROFORM 1X8 LF (GAUZE/BANDAGES/DRESSINGS) ×1 IMPLANT
GLOVE BIO SURGEON STRL SZ 6 (GLOVE) ×2 IMPLANT
GLOVE BIO SURGEON STRL SZ7.5 (GLOVE) ×1 IMPLANT
GLOVE BIOGEL PI IND STRL 6.5 (GLOVE) ×1 IMPLANT
GLOVE BIOGEL PI IND STRL 8 (GLOVE) ×1 IMPLANT
GLOVE ECLIPSE 8.0 STRL XLNG CF (GLOVE) ×1 IMPLANT
GOWN STRL REUS W/ TWL LRG LVL3 (GOWN DISPOSABLE) ×2 IMPLANT
GOWN STRL REUS W/TWL XL LVL3 (GOWN DISPOSABLE) ×1 IMPLANT
KIT CVD SPEAR FBRTK 1.8 DRILL (KITS) IMPLANT
KIT PERC INSERT 3.0 KNTLS (KITS) IMPLANT
KIT PERCUTANEOUS FBRTK 1.8 (KITS) IMPLANT
KIT PUSHLOCK 2.9 HIP (KITS) IMPLANT
KIT STR SPEAR 1.8 FBRTK DISP (KITS) IMPLANT
LASSO 90 CVE QUICKPAS (DISPOSABLE) IMPLANT
LASSO CRESCENT QUICKPASS (SUTURE) IMPLANT
MANIFOLD NEPTUNE II (INSTRUMENTS) ×1 IMPLANT
NDL SAFETY ECLIPSE 18X1.5 (NEEDLE) ×1 IMPLANT
PACK ARTHROSCOPY DSU (CUSTOM PROCEDURE TRAY) ×1 IMPLANT
PACK BASIN DAY SURGERY FS (CUSTOM PROCEDURE TRAY) ×1 IMPLANT
PAD COLD SHLDR WRAP-ON (PAD) ×1 IMPLANT
SHEET MEDIUM DRAPE 40X70 STRL (DRAPES) ×1 IMPLANT
SLEEVE ARM SUSPENSION SYSTEM (MISCELLANEOUS) ×1 IMPLANT
SLEEVE SCD COMPRESS KNEE MED (STOCKING) ×1 IMPLANT
SLING S3 LATERAL DISP (MISCELLANEOUS) ×1 IMPLANT
SUT ETHILON 3 0 PS 1 (SUTURE) ×1 IMPLANT
SUT VIC AB 2-0 CT1 TAPERPNT 27 (SUTURE) IMPLANT
SUTURE FIBERWR #2 38 T-5 BLUE (SUTURE) IMPLANT
SUTURE TAPE TIGERLINK 1.3MM BL (SUTURE) IMPLANT
SYR 5ML LL (SYRINGE) ×1 IMPLANT
TOWEL GREEN STERILE FF (TOWEL DISPOSABLE) ×2 IMPLANT
TUBE CONNECTING 20X1/4 (TUBING) ×1 IMPLANT
TUBING ARTHROSCOPY IRRIG 16FT (MISCELLANEOUS) ×1 IMPLANT
WAND ABLATOR APOLLO I90 (BUR) ×1 IMPLANT

## 2024-05-30 NOTE — Brief Op Note (Signed)
   Brief Op Note  Date of Surgery: 05/30/2024  Preoperative Diagnosis: LEFT SHOULDER INSTABILITY  Postoperative Diagnosis: same  Procedure: Procedure(s): ARTHROSCOPY, SHOULDER, WITH GLENOID LABRUM REPAIR  Implants: Implant Name Type Inv. Item Serial No. Manufacturer Lot No. LRB No. Used Action  ANCHOR SUT 1.8 FIBERTAK SB KL - [id number] Anchor ANCHOR SUT 1.8 FIBERTAK SB KL  [employer] [id number] Left 1 Implanted  ANCHOR SUT 1.8 FIBERTAK SB KL - [id number] Anchor ANCHOR SUT 1.8 FIBERTAK SB KL  [employer] [id number] Left 1 Implanted  ANCHOR SUT 1.8 FIBERTAK SB KL - [id number] Anchor ANCHOR SUT 1.8 FIBERTAK SB KL  [employer] [id number] Left 1 Implanted  ANCHOR SUT FBRTK 2.6 SP #5 - [id number] Anchor ANCHOR SUT FBRTK 2.6 SP #5  [employer] [id number] Left 1 Implanted  ANCHOR SUT FBRTK 2.6 SP #5 - [id number] Anchor ANCHOR SUT FBRTK 2.6 SP #5  [employer] [id number] Left 1 Implanted  ANCHOR SUT 1.8 FIBERTAK SB KL - [id number] Anchor ANCHOR SUT 1.8 FIBERTAK SB KL  [employer] [id number] Left 1 Implanted  ANCHOR SUT 1.8 FIBERTAK SB KL - [id number] Anchor ANCHOR SUT 1.8 [employer] [id number] Left 1 Implanted    Surgeons: Surgeon(s): Wilhelmenia Harada, MD  Anesthesia: General    Estimated Blood Loss: See anesthesia record  Complications: None  Condition to PACU: Stable  Carmina Chris, MD 05/30/2024 11:55 AM

## 2024-05-30 NOTE — Progress Notes (Signed)
Assisted Dr. Renold Don with left, supraclavicular, ultrasound guided block. Side rails up, monitors on throughout procedure. See vital signs in flow sheet. Tolerated Procedure well.

## 2024-05-30 NOTE — Discharge Instructions (Addendum)
 No tylenol until 4:00 p.m.  No ibuprofen  until 5pm      Discharge Instructions    Attending Surgeon: Wilhelmenia Harada, MD Office Phone Number: [phone]   Diagnosis and Procedures:    Surgeries Performed: Left shoulder labral repair with remplissage  Discharge Plan:    Diet: Resume usual diet. Begin with light or bland foods.  Drink plenty of fluids.  Activity:  Keep sling and dressing in place until your follow up visit in Physical Therapy You are advised to go home directly from the hospital or surgical center. Restrict your activities.  GENERAL INSTRUCTIONS: 1.  Keep your surgical site elevated above your heart for at least 5-7 days or longer to prevent swelling. This will improve your comfort and your overall recovery following surgery.     2. Please call Dr. Verline Glow office at [phone] with questions Monday-Friday during business hours. If no one answers, please leave a message and someone should get back to the patient within 24 hours. For emergencies please call 911 or proceed to the emergency room.   3. Patient to notify surgical team if experiences any of the following: Bowel/Bladder dysfunction, uncontrolled pain, nerve/muscle weakness, incision with increased drainage or redness, nausea/vomiting and Fever greater than 101.0 F.  Be alert for signs of infection including redness, streaking, odor, fever or chills. Be alert for excessive pain or bleeding and notify your surgeon immediately.  WOUND INSTRUCTIONS:   Leave your dressing/cast/splint in place until your post operative visit.  Keep it clean and dry.  Always keep the incision clean and dry until the staples/sutures are removed. If there is no drainage from the incision you should keep it open to air. If there is drainage from the incision you must keep it covered at all times until the drainage stops  Do not soak in a bath tub, hot tub, pool, lake or other body of water until 21 days after your surgery  and your incision is completely dry and healed.  If you have removable sutures (or staples) they must be removed 10-14 days (unless otherwise instructed) from the day of your surgery.     1)  Elevate the extremity as much as possible.  2)  Keep the dressing clean and dry.  3)  Please call us  if the dressing becomes wet or dirty.  4)  If you are experiencing worsening pain or worsening swelling, please call.     MEDICATIONS: Resume all previous home medications at the previous prescribed dose and frequency unless otherwise noted Start taking the  pain medications on an as-needed basis as prescribed  Please taper down pain medication over the next week following surgery.  Ideally you should not require a refill of any narcotic pain medication.  Take pain medication with food to minimize nausea. In addition to the prescribed pain medication, you may take over-the-counter pain relievers such as Tylenol.  Do NOT take additional tylenol if your pain medication already has tylenol in it.  Aspirin 325mg  daily per bottle instructions. Narcotic Policy: Per [facility] clinic policy, our goal is ensure optimal postoperative pain control with a multimodal pain management strategy. For all [facility] patients, our goal is to wean post-operative narcotic medications by 6 weeks post-operatively, and many times sooner. If this is not possible due to utilization of pain medication prior to surgery, your [facility] doctor will support your acute post-operative pain control for the first 6 weeks postoperatively, with a plan to transition you back to your primary pain team following  that. Max Spain will work to ensure a smooth handoff.      FOLLOWUP INSTRUCTIONS: 1. Follow up at the Physical Therapy Clinic 3-4 days following surgery. This appointment should be scheduled unless other arrangements have been made.The Physical Therapy scheduling number is [phone] if an appointment has not already been arranged.  2.  Contact Dr. Verline Glow office during office hours at [phone] or the practice after hours line at [phone] for non-emergencies. For medical emergencies call 911.   Discharge Location: Home   No Tylenol before 4:00pm. No ibuprofen  before 7:30pm.  Post Anesthesia Home Care Instructions  Activity: Get plenty of rest for the remainder of the day. A responsible individual must stay with you for 24 hours following the procedure.  For the next 24 hours, DO NOT: -Drive a car -Advertising copywriter -Drink alcoholic beverages -Take any medication unless instructed by your physician -Make any legal decisions or sign important papers.  Meals: Start with liquid foods such as gelatin or soup. Progress to regular foods as tolerated. Avoid greasy, spicy, heavy foods. If nausea and/or vomiting occur, drink only clear liquids until the nausea and/or vomiting subsides. Call your physician if vomiting continues.  Special Instructions/Symptoms: Your throat may feel dry or sore from the anesthesia or the breathing tube placed in your throat during surgery. If this causes discomfort, gargle with warm salt water. The discomfort should disappear within 24 hours.   Regional Anesthesia Blocks  1. You may not be able to move or feel the "blocked" extremity after a regional anesthetic block. This may last may last from 3-48 hours after placement, but it will go away. The length of time depends on the medication injected and your individual response to the medication. As the nerves start to wake up, you may experience tingling as the movement and feeling returns to your extremity. If the numbness and inability to move your extremity has not gone away after 48 hours, please call your surgeon.   2. The extremity that is blocked will need to be protected until the numbness is gone and the strength has returned. Because you cannot feel it, you will need to take extra care to avoid injury. Because it may be weak, you  may have difficulty moving it or using it. You may not know what position it is in without looking at it while the block is in effect.  3. For blocks in the legs and feet, returning to weight bearing and walking needs to be done carefully. You will need to wait until the numbness is entirely gone and the strength has returned. You should be able to move your leg and foot normally before you try and bear weight or walk. You will need someone to be with you when you first try to ensure you do not fall and possibly risk injury.  4. Bruising and tenderness at the needle site are common side effects and will resolve in a few days.  5. Persistent numbness or new problems with movement should be communicated to the surgeon or the [facility] [phone] [facility] [phone]).

## 2024-05-30 NOTE — Anesthesia Preprocedure Evaluation (Addendum)
 Anesthesia Evaluation  Patient identified by MRN, date of birth, ID band Patient awake    Reviewed: Allergy & Precautions, NPO status , Patient's Chart, lab work & pertinent test results  Airway Mallampati: II  TM Distance: >3 FB Neck ROM: Full    Dental no notable dental hx. (+) Dental Advisory Given, Teeth Intact   Pulmonary neg pulmonary ROS   Pulmonary exam normal breath sounds clear to auscultation       Cardiovascular negative cardio ROS Normal cardiovascular exam Rhythm:Regular Rate:Normal     Neuro/Psych  PSYCHIATRIC DISORDERS Anxiety     negative neurological ROS     GI/Hepatic negative GI ROS, Neg liver ROS,,,  Endo/Other  negative endocrine ROS    Renal/GU negative Renal ROS     Musculoskeletal negative musculoskeletal ROS (+)    Abdominal   Peds  Hematology negative hematology ROS (+)   Anesthesia Other Findings   Reproductive/Obstetrics                             Anesthesia Physical Anesthesia Plan  ASA: 2  Anesthesia Plan: General   Post-op Pain Management: Tylenol PO (pre-op)*, Regional block*, Gabapentin PO (pre-op)* and Toradol IV (intra-op)*   Induction: Intravenous  PONV Risk Score and Plan: 3 and Ondansetron, Dexamethasone, Treatment may vary due to age or medical condition and Midazolam  Airway Management Planned: Oral ETT  Additional Equipment:   Intra-op Plan:   Post-operative Plan: Extubation in OR  Informed Consent: I have reviewed the patients History and Physical, chart, labs and discussed the procedure including the risks, benefits and alternatives for the proposed anesthesia with the patient or authorized representative who has indicated his/her understanding and acceptance.     Dental advisory given  Plan Discussed with: CRNA  Anesthesia Plan Comments: (Risks of anesthesia explained at length. This includes, but is not limited to, sore  throat, damage to teeth, lips gums, tongue and vocal cords, nausea and vomiting, reactions to medications, stroke, heart attack, and death. All patient questions were answered and the patient wishes to proceed. Risks of peripheral nerve block explained at length. This includes, but is not limited to, bleeding, infection, reactions to the medications, seizures, damage to surrounding structures, damage to nerves, permanent weakness, numbness, tingling and pain. All patient questions were answered and patient wishes to proceed with nerve block. )        Anesthesia Quick Evaluation

## 2024-05-30 NOTE — Op Note (Signed)
 Date of Surgery: 05/30/2024  INDICATIONS: [name] is a 18 y.o.-year-old female with recurrent left shoulder instability.  The risk and benefits of the procedure were discussed in detail and documented in the pre-operative evaluation.   PREOPERATIVE DIAGNOSIS: 1.  Left shoulder recurrent instability  POSTOPERATIVE DIAGNOSIS: Same.  PROCEDURE: 1.  Left shoulder inferior labral repair 2.  Left shoulder arthroscopic remplissage  SURGEON: Carmina Chris MD  ASSISTANT: Deon Flatter, ATC  ANESTHESIA:  general  IV FLUIDS AND URINE: See anesthesia record.  ANTIBIOTICS: Ancef  ESTIMATED BLOOD LOSS: 10 mL.  IMPLANTS:  Implant Name Type Inv. Item Serial No. Manufacturer Lot No. LRB No. Used Action  ANCHOR SUT 1.8 FIBERTAK SB KL - [id number] Anchor ANCHOR SUT 1.8 FIBERTAK SB KL  [employer] [id number] Left 1 Implanted  ANCHOR SUT 1.8 FIBERTAK SB KL - [id number] Anchor ANCHOR SUT 1.8 FIBERTAK SB KL  [employer] [id number] Left 1 Implanted  ANCHOR SUT 1.8 FIBERTAK SB KL - [id number] Anchor ANCHOR SUT 1.8 FIBERTAK SB KL  [employer] [id number] Left 1 Implanted  ANCHOR SUT FBRTK 2.6 SP #5 - [id number] Anchor ANCHOR SUT FBRTK 2.6 SP #5  [employer] [id number] Left 1 Implanted  ANCHOR SUT FBRTK 2.6 SP #5 - [id number] Anchor ANCHOR SUT FBRTK 2.6 SP #5  [employer] [id number] Left 1 Implanted  ANCHOR SUT 1.8 FIBERTAK SB KL - [id number] Anchor ANCHOR SUT 1.8 FIBERTAK SB KL  [employer] [id number] Left 1 Implanted  ANCHOR SUT 1.8 FIBERTAK SB KL - [id number] Anchor ANCHOR SUT 1.8 FIBERTAK SB KL  [employer] [id number] Left 1 Implanted    DRAINS: None  CULTURES: None  COMPLICATIONS: none  DESCRIPTION OF PROCEDURE:   Examination under anesthesia revealed forward elevation of 165 degrees.  In abduction, there was 90 degrees of external rotation and 70 degrees of internal rotation.  With the arm at the side, there was 70 degrees of external rotation.  There is a 2+ anterior load shift and a 2+  posterior load shift with palpable click as the humerus moved over the glenoid.  Arthroscopic findings demonstrated:  Glenoid cartilage: Normal Humeral head: Normal Labrum:  labral tear from 3 o'clock to 9 o'clock Biceps insertion: Intact Biceps tendon: Intact Subscapularis insertion: Normal Rotator cuff: Normal  The patient was identified in the preoperative holding area.  The correct site was marked according to universal protocol.  Anesthesia performed an interscalene nerve block.  Ancef was given 1 hour prior to skin incision.  The patient was subsequently taken back to the operating room.  The patient was prepped and draped and positioned in the lateral position.  All bony prominences were padded.  Final timeout was performed.  Standard posterior, anterior and anterosuperolateral portals were utilized. The posterior portal was created with an 11-blade and the arthroscope introduced into the glenohumeral joint.  A full diagnostic arthroscopy was performed as described above.  A low anterior portal just above the rolled border of the subscapularis was identified with a spinal needle, and then instrumenting cannula was placed.  An anterosuperolateral viewing portal was localized with a spinal needle just posterior to the biceps tendon, and the arthroscope was transferred to this portal.  A posterior portal was also placed for instrumentation.  First, I directed my attention to preparation of the glenoid, labrum and capsule for repair.  The elevator was used to elevate off the injured labrum from the glenoid rim.  A shaver was subsequently introduced and used on forward in order  to create bleeding bony bed for the labrum to heal back to.  Debridement was performed of the posterior and inferior labrum with combination of electrocautery and shaver.  Next, I sequentially repaired the capsule and labrum from the 9:00 position to the 3:00 position with a total of 3 anchors.  These were all suture  knotless anchors as noted above.  Anchors were placed at the 4:30, 7:30, 6:00 positions.  At each location, a pilot hole was drilled, the anchor inserted and deployed.  Then, one limb of suture was shuttled around the labrum and capsule using a suture lasso, taking care to provide both medial to lateral and inferior to superior shift of the tissues.  This was subsequently fed into the knotless mechanism and tensioned.  This was done sequentially for all additional anchors.  At this time due to the size of the Hill-Sachs lesion and her multiple instances of instability with likely at least subcritical glenoid bone loss the decision was made to also perform a remplissage.  The posterior cannula was removed.  The cannula for the knotless fiber tack was introduced.  This was then drilled.  A second anchor was then placed through a separate tissue plane.  These were then lengthened to each other and sequentially tightened with the traction removed.  This had excellent restoration of the infraspinatus into the glenoid bone.  Once completed, the labrum was restored to an anatomic position, and tension was restored to both bands of the IGHL.  The inferior capsular volume was normalized and the humeral head was centered on the glenoid.   All instruments were removed, fluid was evacuated, and the arthroscopy portals were closed with 3-0 nylon.  A sterile dressing was applied with Xeroform, gauze, ABD and Medipore tape followed by a Iceman device and a sling with an abduction pillow.    The patient awoke from anesthesia without difficulty and was transferred to PACU in stable condition.      POSTOPERATIVE PLAN: She will follow the arthroscopic labral repair protocol.  I will plan to see her back in 2 weeks for suture removal.  She will be placed on aspirin for blood clot prevention  Carmina Chris, MD 11:55 AM

## 2024-05-30 NOTE — H&P (Signed)
 Signed     Expand All Collapse All       Chief Complaint: Left shoulder instability        History of Present Illness:      Marissa [name] is a 18 y.o. female left-hand-dominant female presents with left shoulder pain after approximately 10 dislocations.  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  She is here today for discussion of her recurrent shoulder instability.  She has trialed bracing in the past.  She has tried physical therapy for strengthening but despite this has had recurrent episodes.  She has not had approximately 10 episodes of either location or subluxation.  She has never required a formal reduction.  She is here today for MRI discussion.       PMH/PSH/Family History/Social History/Meds/Allergies:         Past Medical History:  Diagnosis Date   ADHD (attention deficit hyperactivity disorder)     Allergy      Phreesia 05/08/2021   Closed fracture of base of proximal phalanx of right thumb 06/04/2015    Salter-Harris type II Ortho: [place] [facility] [place])    Nocturnal enuresis 03/13/2014   Shoulder subluxation, left, initial encounter 01/12/2022   Urinary urgency 08/14/2015        No past surgical history on file.     Social History         Socioeconomic History   Marital status: Single      Spouse name: Not on file   Number of children: Not on file   Years of education: Not on file   Highest education level: Not on file  Occupational History   Not on file  Tobacco Use   Smoking status: Never      Passive exposure: Never   Smokeless tobacco: Never  Substance and Sexual Activity   Alcohol use: Not on file   Drug use: Not on file   Sexual activity: Not on file  Other Topics Concern   Not on file  Social History Narrative    11th grade [employer] 22-23 school year.     Adopted at a young age    Social Drivers of Acupuncturist Strain: Not on file  Food Insecurity: No Food Insecurity  (08/17/2022)    Hunger Vital Sign     Worried About Running Out of Food in the Last Year: Never true     Ran Out of Food in the Last Year: Never true  Transportation Needs: Not on file  Physical Activity: Not on file  Stress: Not on file  Social Connections: Not on file    No family history on file.     Allergies       Allergies  Allergen Reactions   Other Itching      Seasonal             Current Outpatient Medications  Medication Sig Dispense Refill   buPROPion  (WELLBUTRIN  XL) 300 MG 24 hr tablet Take 300 mg by mouth at bedtime. (Patient not taking: Reported on 12/09/2023)       cetirizine  (ZYRTEC ) 10 MG tablet Take 1 tablet (10 mg total) by mouth daily. 30 tablet 11   fluticasone  (FLONASE ) 50 MCG/ACT nasal spray Place 1 spray into both nostrils daily as needed for allergies or rhinitis. 16 mL 3   guanFACINE (INTUNIV) 4 MG TB24 ER tablet Take 4 mg by mouth every morning.  hydrocortisone  2.5 % cream Mixed 1:1 with Eucerin Cream by pharmacy. Use daily PRN dry skin. 424 g 5   hydrOXYzine (ATARAX) 50 MG tablet Take 50 mg by mouth daily as needed.       polyethylene glycol powder (GLYCOLAX /MIRALAX ) 17 GM/SCOOP powder Take one capful in 8 ounces once a day as needed 225 g 5   RETIN-A  0.01 % gel Apply topically at bedtime. Apply moisturizing cream afterwards. Use sunscreen while using this product. (Patient not taking: Reported on 12/09/2023) 45 g 5   REXULTI 3 MG TABS Take 1 tablet by mouth daily. (Patient not taking: Reported on 12/09/2023)          No current facility-administered medications for this visit.      Imaging Results (Last 48 hours)  No results found.     Review of Systems:   A ROS was performed including pertinent positives and negatives as documented in the HPI.   Physical Exam :   Constitutional: NAD and appears stated age Neurological: Alert and oriented Psych: Appropriate affect and cooperative There were no vitals taken for this visit.     Comprehensive Musculoskeletal Exam:     Left shoulder with positive anterior apprehension, 2+ anterior load-and-shift.  Range of motion is 270 degrees bilaterally.  Internal rotation is to L1 bilaterally external rotation at the side to 90 degrees is 80 degrees with internal rotation to 70 degrees.  Distal neurosensory exam is intact     Imaging:   Xray (3 views left shoulder): Hill Sachs lesion   MRI (left shoulder): With left shoulder anterior-inferior labral tear with a small degree of glenoid bone loss anteriorly and a Hill-Sachs lesion with an Ontrack lesion     I personally reviewed and interpreted the radiographs.     Assessment and Plan:   18 y.o. 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  I did describe that her lesion is on track and given that I would recommend labral repair with a remplissage depending on the size of her Hill-Sachs lesion at the time of arthroscopy.  I did discuss the risk limitations.  At this time she has trialed conservative management as well as physical therapy and bracing without any relief.  She has had 10 episodes of subluxation and given this I did describe that I would recommend surgical treatment in order to prevent any future instability.   -Lan for left shoulder arthroscopy with labral repair and remplissage     After a lengthy discussion of treatment options, including risks, benefits, alternatives, complications of surgical and nonsurgical conservative options, the patient elected surgical repair.    The patient  is aware of the material risks  and complications including, but not limited to injury to adjacent structures, neurovascular injury, infection, numbness, bleeding, implant failure, thermal burns, stiffness, persistent pain, failure to heal, disease transmission from allograft, need for further surgery, dislocation, anesthetic risks, blood clots, risks of death,and others. The  probabilities of surgical success and failure discussed with patient given their particular co-morbidities.The time and nature of expected rehabilitation and recovery was discussed.The patient's questions were all answered preoperatively.  No barriers to understanding were noted. I explained the natural history of the disease process and Rx rationale.  I explained to the patient what I considered to be reasonable expectations given their personal situation.  The final treatment plan was arrived at through a shared patient decision making process model.  I personally saw and evaluated the patient, and participated in the management and treatment plan.   Wilhelmenia Harada, MD Attending Physician, Orthopedic Surgery   This document was dictated using Dragon voice recognition software. A reasonable attempt at proof reading has been made to minimize errors.        Electronically signed by Wilhelmenia Harada, MD at 03/31/2024 12:11 PM

## 2024-05-30 NOTE — Transfer of Care (Signed)
 Immediate Anesthesia Transfer of Care Note  Patient: Marissa [name]  Procedure(s) Performed: ARTHROSCOPY, SHOULDER, WITH GLENOID LABRUM REPAIR (Left: Shoulder)  Patient Location: PACU  Anesthesia Type:General  Level of Consciousness: drowsy and patient cooperative  Airway & Oxygen Therapy: Patient Spontanous Breathing and Patient connected to face mask oxygen  Post-op Assessment: Report given to RN and Post -op Vital signs reviewed and stable  Post vital signs: Reviewed and stable  Last Vitals:  Vitals Value Taken Time  BP 105/62 05/30/24 1205  Temp    Pulse 59 05/30/24 1215  Resp 15 05/30/24 1215  SpO2 100 % 05/30/24 1215  Vitals shown include unfiled device data.  Last Pain:  Vitals:   05/30/24 0952  TempSrc: Temporal  PainSc: 0-No pain      Patients Stated Pain Goal: 3 (05/30/24 1610)  Complications: No notable events documented.

## 2024-05-30 NOTE — Anesthesia Procedure Notes (Signed)
 Procedure Name: Intubation Date/Time: 05/30/2024 10:46 AM  Performed by: Darcel Early, CRNAPre-anesthesia Checklist: Patient identified, Emergency Drugs available, Suction available and Patient being monitored Patient Re-evaluated:Patient Re-evaluated prior to induction Oxygen Delivery Method: Circle system utilized Preoxygenation: Pre-oxygenation with 100% oxygen Induction Type: IV induction Ventilation: Mask ventilation without difficulty Grade View: Grade I Tube type: Oral Tube size: 7.0 mm Number of attempts: 1 Airway Equipment and Method: Stylet Placement Confirmation: ETT inserted through vocal cords under direct vision, positive ETCO2 and breath sounds checked- equal and bilateral Secured at: 22 cm Tube secured with: Tape Dental Injury: Teeth and Oropharynx as per pre-operative assessment

## 2024-05-30 NOTE — Anesthesia Postprocedure Evaluation (Signed)
 Anesthesia Post Note  Patient: Marissa [name]  Procedure(s) Performed: ARTHROSCOPY, SHOULDER, WITH GLENOID LABRUM REPAIR (Left: Shoulder)     Patient location during evaluation: PACU Anesthesia Type: General Level of consciousness: sedated and patient cooperative Pain management: pain level controlled Vital Signs Assessment: post-procedure vital signs reviewed and stable Respiratory status: spontaneous breathing Cardiovascular status: stable Anesthetic complications: no   No notable events documented.  Last Vitals:  Vitals:   05/30/24 1330 05/30/24 1353  BP: 100/66 (!) 93/56  Pulse: (!) 58 (!) 57  Resp: 16 16  Temp:  (!) 36.4 C  SpO2: 96% 97%    Last Pain:  Vitals:   05/30/24 1353  TempSrc:   PainSc: 0-No pain                 Gorman Laughter

## 2024-05-31 ENCOUNTER — Encounter (HOSPITAL_BASED_OUTPATIENT_CLINIC_OR_DEPARTMENT_OTHER): Payer: Self-pay | Admitting: Orthopaedic Surgery

## 2024-06-02 ENCOUNTER — Encounter (HOSPITAL_BASED_OUTPATIENT_CLINIC_OR_DEPARTMENT_OTHER): Payer: Self-pay | Admitting: Physical Therapy

## 2024-06-02 ENCOUNTER — Other Ambulatory Visit: Payer: Self-pay

## 2024-06-02 ENCOUNTER — Ambulatory Visit (HOSPITAL_BASED_OUTPATIENT_CLINIC_OR_DEPARTMENT_OTHER): Attending: Orthopaedic Surgery | Admitting: Physical Therapy

## 2024-06-02 DIAGNOSIS — M25512 Pain in left shoulder: Secondary | ICD-10-CM | POA: Diagnosis present

## 2024-06-02 DIAGNOSIS — M6281 Muscle weakness (generalized): Secondary | ICD-10-CM | POA: Insufficient documentation

## 2024-06-02 NOTE — Therapy (Addendum)
 OUTPATIENT PHYSICAL THERAPY UPPER EXTREMITY EVALUATION   Patient Name: Marissa [name] MRN: [id number] DOB:03/31/2006, 18 y.o., female Today's Date: 06/02/2024  END OF SESSION:  PT End of Session - 06/02/24 1019     Visit Number 1    Number of Visits 20    Date for PT Re-Evaluation 08/31/24    Authorization Type [place] Medicaid    PT Start Time 1019    PT Stop Time 1100    PT Time Calculation (min) 41 min    Equipment Utilized During Treatment Other (comment)   sling   Activity Tolerance Patient tolerated treatment well    Behavior During Therapy [facility] for tasks assessed/performed             Past Medical History:  Diagnosis Date   ADHD (attention deficit hyperactivity disorder)    Allergy    Phreesia 05/08/2021   Closed fracture of base of proximal phalanx of right thumb 06/04/2015   Salter-Harris type II Ortho: [place] [facility] [place])    Nocturnal enuresis 03/13/2014   Shoulder subluxation, left, initial encounter 01/12/2022   Urinary urgency 08/14/2015   Past Surgical History:  Procedure Laterality Date   SHOULDER ARTHROSCOPY WITH LABRAL REPAIR Left 05/30/2024   SHOULDER ARTHROSCOPY WITH LABRAL REPAIR Left 05/30/2024   Procedure: ARTHROSCOPY, SHOULDER, WITH GLENOID LABRUM REPAIR;  Surgeon: Wilhelmenia Harada, MD;  Location: [facility];  Service: Orthopedics;  Laterality: Left;  LEFT SHOULDER ARTHROSCOPY WITH LABRAL REPAIR AND REMPLISSAGE   WISDOM TOOTH EXTRACTION     Patient Active Problem List   Diagnosis Date Noted   Shoulder instability, left 05/30/2024   Incontinence of urine 06/21/2023   Panic attacks 08/17/2022   Acne 05/10/2021   Body odor 05/10/2021   Osgood-Schlatter's disease of knees, bilateral 09/07/2016   History of snoring 08/27/2015   PTSD (post-traumatic stress disorder) 11/28/2013   Allergic rhinitis 09/20/2013   Eczema 09/20/2013   Adopted 09/20/2013    PCP: Lavonda Pour, MD   REFERRING PROVIDER:   Wilhelmenia Harada, MD     REFERRING DIAG:  [phone] (ICD-10-CM) - Shoulder instability, left      THERAPY DIAG:  Acute pain of left shoulder  Muscle weakness (generalized)  Rationale for Evaluation and Treatment: Rehabilitation  ONSET DATE: 05/30/24 DOS  Days since surgery: 3  PROCEDURE: 1.  Left shoulder inferior labral repair 2.  Left shoulder arthroscopic remplissage  SUBJECTIVE:                                                                                                                                                                                      SUBJECTIVE STATEMENT:  [facility]  to [facility].  Initial MOI was during basketball.  Pt has had very little pain post op. Pt is not able to feel the L shoulder area, denies NT or s/s infection and DVT. Pt has been using her sling but detached the straps last night and is unsure of how to put it back on. Pt has not changed surgical bandages. Pt has been icing with her machine. Pt is taking meds at directed. Has not had a bowel movement in last 3 days. Slept in chair for first 2 days and is now sleeping in bed.    PERTINENT HISTORY: PTSD  PAIN:  Are you having pain? 0/10: none right now at rest; has had pain into L shoulder surgical sites  Pain description: surgical pain and spasm Aggravating factors: movement, at baseline Relieving factors: rest, meds ice  PRECAUTIONS: Shoulder  RED FLAGS: None   WEIGHT BEARING RESTRICTIONS: Yes WNB L UE  FALLS:  Has patient fallen in last 6 months? No  LIVING ENVIRONMENT: Lives with: lives with their family Lives in: House/apartment Has following equipment at home: None  OCCUPATION: Consulting civil engineer- art major, plays rec sports, video gaming, reading, drawing for leisure  PLOF: Independent  PATIENT GOALS: return to normalized activity    OBJECTIVE:  Note: Objective measures were completed at Evaluation unless otherwise noted.  DIAGNOSTIC FINDINGS:    IMPRESSION: 1. Sequela of  prior anterior left glenohumeral joint dislocation with small Hill-Sachs impaction deformity and soft tissue Bankart injury. No detached labral tear. 2. Partial-thickness chondral surface irregularity of the anteroinferior glenoid. 3. Small partial thickness interstitial tear of the distal supraspinatus tendon. No full-thickness or retracted rotator cuff tear. 4. Mild subacromial-subdeltoid bursitis.     Electronically Signed   By: Leverne Reading D.O.   On: 03/13/2024 18:24    PATIENT SURVEYS : Upper Extremity Function Scale: 65 / 80 = 81.3 %    COGNITION:           Overall cognitive status: Within functional limits for tasks assessed                                  SENSATION: WFL   UPPER EXTREMITY PROM:    Flexion to 30, ABD 30, IR  belly, ER 0   UPPER EXTREMITY MMT: unable to test due to surgical precautions    PALPATION:  Numbness around incision sites;   Incision sites: clean, dry, no erythema or drainage noted; surgical bandages in place   TODAY'S TREATMENT:    Bandage change; incision areas sanitized and portals covered with gauze and Tegaderm; xeroform left in place; extra bandages provided     Exercises - Seated Scapular Retraction  - 2-3 x daily - 7 x weekly - 1 sets - 20 reps - Wrist Flexion Extension AROM with Fingers Curled and Palm Down  - 2-3 x daily - 7 x weekly - 2 sets - 20 reps - Seated Elbow Flexion and Extension AROM  - 2-3 x daily - 7 x weekly - 2 sets - 15 reps - Putty Squeezes  - 8-10 x daily - 7 x weekly - 1 sets - 20 reps  Sleeping position, safety/protocol precautions, surgical technique and effects on rehab   PATIENT EDUCATION: Education details: signs of infection, cryotherapy, edema management, joint protection, diagnosis, prognosis, anatomy, exercise progression, DOMS expectations, muscle firing,  envelope of function, HEP, POC Person educated: Patient Education method: Explanation, Demonstration, Tactile cues, Verbal  cues, and  Handouts Education comprehension: verbalized understanding, returned demonstration, verbal cues required, tactile cues required, and needs further education     HOME EXERCISE PROGRAM:  Access Code: [email or web address] URL: [email or web address] Date: 06/02/2024 Prepared by: Silver Dross      ASSESSMENT:   CLINICAL IMPRESSION:   Patient is a 18 y.o. female who was seen today for physical therapy evaluation and treatment s/p L inferior labral repair and remplissage.Pt is post op 3 days. Pt has expected ROM deficits, strength deficits, and pain after recent surgery. Pt is functionally limited with ADL, self care, and exercise due to recent surgical intervention as expected. Pt does not have s/s of infection or DVT. Pain is well managed at this time. Plan to continue to progress per protocol. Pt would benefit from continued skilled therapy in order to reach goals and maximize functional L UE strength and ROM for full return to PLOF.    OBJECTIVE IMPAIRMENTS decreased activity tolerance, decreased ROM, decreased strength, increased muscle spasms, impaired UE functional use, improper body mechanics, postural dysfunction, and pain.    ACTIVITY LIMITATIONS cleaning, lifting, bending, carry, dressing, bathing, feeding, community activity, meal prep, laundry, and yard work.    PERSONAL FACTORS Time since onset of injury/illness/exacerbation is also affecting patient's functional outcome.     REHAB POTENTIAL: Good   CLINICAL DECISION MAKING: Stable/uncomplicated   EVALUATION COMPLEXITY: Low     GOALS:     SHORT TERM GOALS: Target date: 07/14/2024       Pt will become independent with HEP in order to demonstrate synthesis of PT education.   Goal status: INITIAL   2.  Pt will be able to demonstrate full PROM  in order to demonstrate functional improvement in UE for progression to next phase of protocol.     Goal status: INITIAL   3.  Pt will report at least 2 pt reduction on NPRS  scale for pain in order to demonstrate functional improvement with household activity, self care, and ADL.    Goal status: INITIAL       LONG TERM GOALS: Target date: 08/25/2024       Pt  will become independent with final HEP in order to demonstrate synthesis of PT education.   Goal status: INITIAL   2.  Pt will be able to reach [facility] and carry/hold >25lbs in order to demonstrate functional improvement in L UE strength for return to PLOF and exercise.    Goal status: INITIAL   3.  Pt will be able to demonstrate full [place] AROM of the L shoulder in order to demonstrate functional improvement in UE function for self-care and house hold duties.    Goal status: INITIAL   4.  Pt will test to within 80% HHD of R UE in order to progress to plyo loading of shoulder.     Goal status: INITIAL      PLAN: PT FREQUENCY: 1-2x/week   PT DURATION: 12 weeks   PLANNED INTERVENTIONS: Therapeutic exercises, Therapeutic activity, Neuromuscular re-education, Patient/Family education, Joint mobilization, Dry Needling, Spinal mobilization, Cryotherapy, Moist heat, Taping, and Manual therapy, Re-evaluation   PLAN FOR NEXT SESSION:  PROM, review HEP, avoid posterior shoulder stretching/crossbody adduction         Silver Dross, PT 06/02/2024, 11:07 AM

## 2024-06-09 ENCOUNTER — Encounter (HOSPITAL_BASED_OUTPATIENT_CLINIC_OR_DEPARTMENT_OTHER): Admitting: Physical Therapy

## 2024-06-10 ENCOUNTER — Encounter (HOSPITAL_BASED_OUTPATIENT_CLINIC_OR_DEPARTMENT_OTHER): Payer: Self-pay | Admitting: Rehabilitative and Restorative Service Providers"

## 2024-06-10 ENCOUNTER — Ambulatory Visit (HOSPITAL_BASED_OUTPATIENT_CLINIC_OR_DEPARTMENT_OTHER): Admitting: Rehabilitative and Restorative Service Providers"

## 2024-06-10 DIAGNOSIS — M25512 Pain in left shoulder: Secondary | ICD-10-CM

## 2024-06-10 DIAGNOSIS — M6281 Muscle weakness (generalized): Secondary | ICD-10-CM

## 2024-06-10 NOTE — Therapy (Signed)
 OUTPATIENT PHYSICAL THERAPY UPPER EXTREMITY Treatment   Patient Name: Marissa [name] MRN: [id number] DOB:May 30, 2006, 18 y.o., female Today's Date: 06/10/2024  END OF SESSION:  PT End of Session - 06/10/24 1055     Visit Number 2    Number of Visits 13    Date for PT Re-Evaluation 08/31/24    Authorization Type [place] [employer]    PT Start Time 1054    PT Stop Time 1136    PT Time Calculation (min) 42 min    Activity Tolerance Patient tolerated treatment well;No increased pain    Behavior During Therapy [facility] for tasks assessed/performed           Past Medical History:  Diagnosis Date   ADHD (attention deficit hyperactivity disorder)    Allergy    Phreesia 05/08/2021   Closed fracture of base of proximal phalanx of right thumb 06/04/2015   Salter-Harris type II Ortho: [place] [facility])    Nocturnal enuresis 03/13/2014   Shoulder subluxation, left, initial encounter 01/12/2022   Urinary urgency 08/14/2015   Past Surgical History:  Procedure Laterality Date   SHOULDER ARTHROSCOPY WITH LABRAL REPAIR Left 05/30/2024   SHOULDER ARTHROSCOPY WITH LABRAL REPAIR Left 05/30/2024   Procedure: ARTHROSCOPY, SHOULDER, WITH GLENOID LABRUM REPAIR;  Surgeon: Wilhelmenia Harada, MD;  Location: [facility];  Service: Orthopedics;  Laterality: Left;  LEFT SHOULDER ARTHROSCOPY WITH LABRAL REPAIR AND REMPLISSAGE   WISDOM TOOTH EXTRACTION     Patient Active Problem List   Diagnosis Date Noted   Shoulder instability, left 05/30/2024   Incontinence of urine 06/21/2023   Panic attacks 08/17/2022   Acne 05/10/2021   Body odor 05/10/2021   Osgood-Schlatter's disease of knees, bilateral 09/07/2016   History of snoring 08/27/2015   PTSD (post-traumatic stress disorder) 11/28/2013   Allergic rhinitis 09/20/2013   Eczema 09/20/2013   Adopted 09/20/2013    PCP: Lavonda Pour, MD   REFERRING PROVIDER:  Wilhelmenia Harada, MD     REFERRING DIAG:  [id number]  (ICD-10-CM) - Shoulder instability, left      THERAPY DIAG:  Acute pain of left shoulder  Muscle weakness (generalized)  Rationale for Evaluation and Treatment: Rehabilitation  ONSET DATE: 05/30/24 DOS  Days since surgery: 11  PROCEDURE: 1.  Left shoulder inferior labral repair 2.  Left shoulder arthroscopic remplissage  SUBJECTIVE:                                                                                                                                                                                      SUBJECTIVE STATEMENT:  I went to a graduation party and did not wear my sling. I  was not able to get into my exercises when I left from my evaluation. The link didn't work. (PT this visit resent link and pt was able to access and take screenshots in case it stopped working again).  Pt has had very little pain post op. Pt is not able to feel the L shoulder area, denies NT or s/s infection and DVT. Pt has been using her sling but detached the straps last night and is unsure of how to put it back on. Pt has not changed surgical bandages. Pt has been icing with her machine. Pt is taking meds at directed. Has not had a bowel movement in last 3 days. Slept in chair for first 2 days and is now sleeping in bed.    PERTINENT HISTORY: PTSD  PAIN:  Are you having pain? 0/10: none right now at rest; has had pain into L shoulder surgical sites  Pain description: surgical pain and spasm Aggravating factors: movement, at baseline Relieving factors: rest, meds ice  PRECAUTIONS: Shoulder  RED FLAGS: None   WEIGHT BEARING RESTRICTIONS: Yes WNB L UE  FALLS:  Has patient fallen in last 6 months? No  LIVING ENVIRONMENT: Lives with: lives with their family Lives in: House/apartment Has following equipment at home: None  OCCUPATION: Consulting civil engineer- art major, plays rec sports, video gaming, reading, drawing for leisure  PLOF: Independent  PATIENT GOALS: return to normalized activity     OBJECTIVE:  Note: Objective measures were completed at Evaluation unless otherwise noted.  DIAGNOSTIC FINDINGS:    IMPRESSION: 1. 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 No detached labral tear. 2. Partial-thickness chondral surface irregularity of the anteroinferior glenoid. 3. Small partial thickness interstitial tear of the distal supraspinatus tendon. No full-thickness or retracted rotator cuff tear. 4. Mild subacromial-subdeltoid bursitis.     Electronically Signed   By: Leverne Reading D.O.   On: 03/13/2024 18:24    PATIENT SURVEYS : Upper Extremity Function Scale: 65 / 80 = 81.3 %    COGNITION:           Overall cognitive status: Within functional limits for tasks assessed                                  SENSATION: WFL   UPPER EXTREMITY PROM:    Flexion to 30, ABD 30, IR  belly, ER 0   UPPER EXTREMITY MMT: unable to test due to surgical precautions    PALPATION:  Numbness around incision sites;   Incision sites: clean, dry, no erythema or drainage noted; surgical bandages in place   TODAY'S TREATMENT:   [facility] Adult PT Treatment:                                                DATE: 06/10/24 Therapeutic Exercise: Reviewed HEP. Max verbal and tactile cues for proper scapular retractions without compensations.  Manual Therapy: Changed dressing-minimal yellow drainage. No smell. PROM L shoulder per protocol with PT verbal cues for relaxation which pt was able to perform with eyes closed.     Eval:  Bandage change; incision areas sanitized and portals covered with gauze and Tegaderm; xeroform left in place; extra bandages provided     Exercises - Seated Scapular  Retraction  - 2-3 x daily - 7 x weekly - 1 sets - 20 reps - Wrist Flexion Extension AROM with Fingers Curled and Palm Down  - 2-3 x daily - 7 x weekly - 2 sets - 20 reps - Seated Elbow Flexion and  Extension AROM  - 2-3 x daily - 7 x weekly - 2 sets - 15 reps - Putty Squeezes  - 8-10 x daily - 7 x weekly - 1 sets - 20 reps  Sleeping position, safety/protocol precautions, surgical technique and effects on rehab   PATIENT EDUCATION: Education details: signs of infection, cryotherapy, edema management, joint protection, diagnosis, prognosis, anatomy, exercise progression, DOMS expectations, muscle firing,  envelope of function, HEP, POC Person educated: Patient Education method: Explanation, Demonstration, Tactile cues, Verbal cues, and Handouts Education comprehension: verbalized understanding, returned demonstration, verbal cues required, tactile cues required, and needs further education     HOME EXERCISE PROGRAM:  Access Code: [email or web address] URL: [email or web address] Date: 06/02/2024 Prepared by: Silver Dross      ASSESSMENT:   CLINICAL IMPRESSION:   Patient is a 18 y.o. female who was seen today for physical therapy treatment s/p L inferior labral repair and remplissage. Pt has expected ROM deficits, strength deficits, and pain after recent surgery. Pt is functionally limited with ADL, self care, and exercise due to recent surgical intervention as expected. Pain is well managed at this time. Pt tolerated exercise review and PROM without difficulty. Plan to continue to progress per protocol. Pt would benefit from continued skilled therapy in order to reach goals and maximize functional L UE strength and ROM for full return to PLOF.    OBJECTIVE IMPAIRMENTS decreased activity tolerance, decreased ROM, decreased strength, increased muscle spasms, impaired UE functional use, improper body mechanics, postural dysfunction, and pain.    ACTIVITY LIMITATIONS cleaning, lifting, bending, carry, dressing, bathing, feeding, community activity, meal prep, laundry, and yard work.    PERSONAL FACTORS Time since onset of injury/illness/exacerbation is also affecting patient's functional  outcome.     REHAB POTENTIAL: Good   CLINICAL DECISION MAKING: Stable/uncomplicated   EVALUATION COMPLEXITY: Low     GOALS:     SHORT TERM GOALS: Target date: 07/14/2024       Pt will become independent with HEP in order to demonstrate synthesis of PT education.   Goal status: INITIAL   2.  Pt will be able to demonstrate full PROM  in order to demonstrate functional improvement in UE for progression to next phase of protocol.     Goal status: INITIAL   3.  Pt will report at least 2 pt reduction on NPRS scale for pain in order to demonstrate functional improvement with household activity, self care, and ADL.    Goal status: INITIAL       LONG TERM GOALS: Target date: 08/25/2024       Pt  will become independent with final HEP in order to demonstrate synthesis of PT education.   Goal status: INITIAL   2.  Pt will be able to reach [facility] and carry/hold >25lbs in order to demonstrate functional improvement in L UE strength for return to PLOF and exercise.    Goal status: INITIAL   3.  Pt will be able to demonstrate full [place] AROM of the L shoulder in order to demonstrate functional improvement in UE function for self-care and house hold duties.    Goal status: INITIAL   4.  Pt will test to within 80% HHD of R UE in  order to progress to plyo loading of shoulder.     Goal status: INITIAL      PLAN: PT FREQUENCY: 1-2x/week   PT DURATION: 12 weeks   PLANNED INTERVENTIONS: Therapeutic exercises, Therapeutic activity, Neuromuscular re-education, Patient/Family education, Joint mobilization, Dry Needling, Spinal mobilization, Cryotherapy, Moist heat, Taping, and Manual therapy, Re-evaluation   PLAN FOR NEXT SESSION:  PROM,  avoid posterior shoulder stretching/crossbody adduction         Jeannene Milling, PT 06/10/2024, 12:35 PM

## 2024-06-14 ENCOUNTER — Ambulatory Visit (HOSPITAL_BASED_OUTPATIENT_CLINIC_OR_DEPARTMENT_OTHER): Admitting: Orthopaedic Surgery

## 2024-06-14 ENCOUNTER — Encounter (HOSPITAL_BASED_OUTPATIENT_CLINIC_OR_DEPARTMENT_OTHER): Payer: Self-pay

## 2024-06-14 ENCOUNTER — Ambulatory Visit (HOSPITAL_BASED_OUTPATIENT_CLINIC_OR_DEPARTMENT_OTHER)

## 2024-06-14 DIAGNOSIS — M25512 Pain in left shoulder: Secondary | ICD-10-CM

## 2024-06-14 DIAGNOSIS — M25312 Other instability, left shoulder: Secondary | ICD-10-CM

## 2024-06-14 DIAGNOSIS — M6281 Muscle weakness (generalized): Secondary | ICD-10-CM

## 2024-06-14 NOTE — Progress Notes (Signed)
 Post Operative Evaluation    Procedure/Date of Surgery: Left shoulder arthroscopic labral repair 6/3  Interval History:    Presents today 2 weeks status post the above procedure.  Overall she is doing extremely well.  She has been work with physical therapy which is going quite nicely.   PMH/PSH/Family History/Social History/Meds/Allergies:    Past Medical History:  Diagnosis Date   ADHD (attention deficit hyperactivity disorder)    Allergy    Phreesia 05/08/2021   Closed fracture of base of proximal phalanx of right thumb 06/04/2015   Salter-Harris type II Ortho: [place] [facility] [place])    Nocturnal enuresis 03/13/2014   Shoulder subluxation, left, initial encounter 01/12/2022   Urinary urgency 08/14/2015   Past Surgical History:  Procedure Laterality Date   SHOULDER ARTHROSCOPY WITH LABRAL REPAIR Left 05/30/2024   SHOULDER ARTHROSCOPY WITH LABRAL REPAIR Left 05/30/2024   Procedure: ARTHROSCOPY, SHOULDER, WITH GLENOID LABRUM REPAIR;  Surgeon: Wilhelmenia Harada, MD;  Location: [facility];  Service: Orthopedics;  Laterality: Left;  LEFT SHOULDER ARTHROSCOPY WITH LABRAL REPAIR AND REMPLISSAGE   WISDOM TOOTH EXTRACTION     Social History   Socioeconomic History   Marital status: Single    Spouse name: Not on file   Number of children: Not on file   Years of education: Not on file   Highest education level: Not on file  Occupational History   Not on file  Tobacco Use   Smoking status: Never    Passive exposure: Never   Smokeless tobacco: Never  Vaping Use   Vaping status: Never Used  Substance and Sexual Activity   Alcohol use: Never   Drug use: Never   Sexual activity: Not on file  Other Topics Concern   Not on file  Social History Narrative   11th grade [employer] 22-23 school year.    Adopted at a young age   Social Drivers of Corporate investment banker Strain: Not on file  Food  Insecurity: No Food Insecurity (05/01/2024)   Hunger Vital Sign    Worried About Running Out of Food in the Last Year: Never true    Ran Out of Food in the Last Year: Never true  Transportation Needs: Not on file  Physical Activity: Not on file  Stress: Not on file  Social Connections: Not on file   No family history on file. Allergies  Allergen Reactions   Other Itching    Seasonal    Current Outpatient Medications  Medication Sig Dispense Refill   acyclovir  ointment (ZOVIRAX ) 5 % Apply 1 Application topically every 3 (three) hours. 15 g 1   aspirin  EC 325 MG tablet Take 1 tablet (325 mg total) by mouth daily. 14 tablet 0   cetirizine  (ZYRTEC ) 10 MG tablet Take 1 tablet (10 mg total) by mouth daily. 30 tablet 11   Crisaborole  (EUCRISA ) 2 % OINT Apply 60 g topically as needed. Apply to eyelids for itching/eczema on face as needed.  Max 14 days. 60 g 0   fluticasone  (FLONASE ) 50 MCG/ACT nasal spray Place 1 spray into both nostrils daily as needed for allergies or rhinitis. 16 mL 3   hydrocortisone  2.5 % cream Mixed 1:1 with Eucerin Cream by pharmacy. Use daily PRN dry skin. (Patient not taking: Reported on 05/01/2024) 424  g 5   hydrOXYzine (ATARAX) 50 MG tablet Take 50 mg by mouth daily as needed. (Patient not taking: Reported on 05/01/2024)     oxyCODONE  (ROXICODONE ) 5 MG immediate release tablet Take 1 tablet (5 mg total) by mouth every 4 (four) hours as needed for severe pain (pain score 7-10) or breakthrough pain. 10 tablet 0   RETIN-A  0.01 % gel Apply topically at bedtime. Apply moisturizing cream afterwards. Use sunscreen while using this product. (Patient not taking: Reported on 05/01/2024) 45 g 5   triamcinolone  ointment (KENALOG ) 0.5 % Apply topically 2 (two) times daily. Apply to eczema on body.  Not to exceed 14 days. 30 g 0   No current facility-administered medications for this visit.   No results found.  Review of Systems:   A ROS was performed including pertinent positives and  negatives as documented in the HPI.   Musculoskeletal Exam:    Last menstrual period 04/29/2024.  Left shoulder incisions are well-appearing without erythema or drainage.  Sensation is intact distally.  In the spine position she can forward elevate to 90 degrees with external rotation at side to 45 degrees  Imaging:      I personally reviewed and interpreted the radiographs.   Assessment:   18 year old female 2 weeks status post left shoulder labral repair overall doing extremely well.  At this time she will be candidate for the accelerated recovery protocol.  I will plan to see her back in 4 weeks for reassessment.  He may begin active overhead range of motion in 2 weeks  Plan :    - Return to clinic 4 weeks for reassessment      I personally saw and evaluated the patient, and participated in the management and treatment plan.  Wilhelmenia Harada, MD Attending Physician, Orthopedic Surgery  This document was dictated using Dragon voice recognition software. A reasonable attempt at proof reading has been made to minimize errors.

## 2024-06-14 NOTE — Therapy (Addendum)
 OUTPATIENT PHYSICAL THERAPY UPPER EXTREMITY Treatment   Patient Name: Marissa [name] MRN: [id number] DOB:September 07, 2006, 18 y.o., female Today's Date: 06/14/2024  END OF SESSION:  PT End of Session - 06/14/24 1522     Visit Number 3    Number of Visits 13    Date for PT Re-Evaluation 08/31/24    Authorization Type  Medicaid    Authorization Time Period 06/02/2024-08/31/2024    Authorization - Visit Number 3    Authorization - Number of Visits 13    PT Start Time 1519    PT Stop Time 1600    PT Time Calculation (min) 41 min    Activity Tolerance Patient tolerated treatment well    Behavior During Therapy [facility] for tasks assessed/performed            Past Medical History:  Diagnosis Date   ADHD (attention deficit hyperactivity disorder)    Allergy    Phreesia 05/08/2021   Closed fracture of base of proximal phalanx of right thumb 06/04/2015   Salter-Harris type II Ortho: [facility] [place])    Nocturnal enuresis 03/13/2014   Shoulder subluxation, left, initial encounter 01/12/2022   Urinary urgency 08/14/2015   Past Surgical History:  Procedure Laterality Date   SHOULDER ARTHROSCOPY WITH LABRAL REPAIR Left 05/30/2024   SHOULDER ARTHROSCOPY WITH LABRAL REPAIR Left 05/30/2024   Procedure: ARTHROSCOPY, SHOULDER, WITH GLENOID LABRUM REPAIR;  Surgeon: Wilhelmenia Harada, MD;  Location: [facility];  Service: Orthopedics;  Laterality: Left;  LEFT SHOULDER ARTHROSCOPY WITH LABRAL REPAIR AND REMPLISSAGE   WISDOM TOOTH EXTRACTION     Patient Active Problem List   Diagnosis Date Noted   Shoulder instability, left 05/30/2024   Incontinence of urine 06/21/2023   Panic attacks 08/17/2022   Acne 05/10/2021   Body odor 05/10/2021   Osgood-Schlatter's disease of knees, bilateral 09/07/2016   History of snoring 08/27/2015   PTSD (post-traumatic stress disorder) 11/28/2013   Allergic rhinitis 09/20/2013   Eczema 09/20/2013   Adopted 09/20/2013     PCP: Lavonda Pour, MD   REFERRING PROVIDER:  Wilhelmenia Harada, MD     REFERRING DIAG:  [phone] (ICD-10-CM) - Shoulder instability, left      THERAPY DIAG:  Acute pain of left shoulder  Muscle weakness (generalized)  Rationale for Evaluation and Treatment: Rehabilitation  ONSET DATE: 05/30/24 DOS  Days since surgery: 15  PROCEDURE: 1.  Left shoulder inferior labral repair 2.  Left shoulder arthroscopic remplissage  SUBJECTIVE:  SUBJECTIVE STATEMENT: Pt reports exercise compliance and taking medicine. Avoiding reaching movements. Denies pain at entry. She feels more pain at night when laying her bed.   Eval: Pt has had very little pain post op. Pt is not able to feel the L shoulder area, denies NT or s/s infection and DVT. Pt has been using her sling but detached the straps last night and is unsure of how to put it back on. Pt has not changed surgical bandages. Pt has been icing with her machine. Pt is taking meds at directed. Has not had a bowel movement in last 3 days. Slept in chair for first 2 days and is now sleeping in bed.    PERTINENT HISTORY: PTSD  PAIN:  Are you having pain? 0/10: none right now at rest; has had pain into L shoulder surgical sites  Pain description: surgical pain and spasm Aggravating factors: movement, at baseline Relieving factors: rest, meds ice  PRECAUTIONS: Shoulder  RED FLAGS: None   WEIGHT BEARING RESTRICTIONS: Yes WNB L UE  FALLS:  Has patient fallen in last 6 months? No  LIVING ENVIRONMENT: Lives with: lives with their family Lives in: House/apartment Has following equipment at home: None  OCCUPATION: Consulting civil engineer- art major, plays rec sports, video gaming, reading, drawing for leisure  PLOF: Independent  PATIENT GOALS: return to  normalized activity    OBJECTIVE:  Note: Objective measures were completed at Evaluation unless otherwise noted.  DIAGNOSTIC FINDINGS:    IMPRESSION: 1. 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 No detached labral tear. 2. Partial-thickness chondral surface irregularity of the anteroinferior glenoid. 3. Small partial thickness interstitial tear of the distal supraspinatus tendon. No full-thickness or retracted rotator cuff tear. 4. Mild subacromial-subdeltoid bursitis.     Electronically Signed   By: Leverne Reading D.O.   On: 03/13/2024 18:24    PATIENT SURVEYS : Upper Extremity Function Scale: 65 / 80 = 81.3 %    COGNITION:           Overall cognitive status: Within functional limits for tasks assessed                                  SENSATION: WFL   UPPER EXTREMITY PROM:    Flexion to 30, ABD 30, IR  belly, ER 0   UPPER EXTREMITY MMT: unable to test due to surgical precautions    PALPATION:  Numbness around incision sites;   Incision sites: clean, dry, no erythema or drainage noted; surgical bandages in place   TODAY'S TREATMENT:     6/18 -PROM L shoulder -Supine wand press up 2x10 -Supine wand flexion 2x10 (to 90deg) -Scap squeezes 3 2x10 -Isometric flexion and abduction 5 x20ea    [facility] Adult PT Treatment:                                                DATE: 06/10/24 Therapeutic Exercise: Reviewed HEP. Max verbal and tactile cues for proper scapular retractions without compensations.  Manual Therapy: Changed dressing-minimal yellow drainage. No smell. PROM L shoulder per protocol with PT verbal cues for relaxation which pt was able to perform with eyes closed.     Eval:  Bandage change; incision areas sanitized and portals  covered with gauze and Tegaderm; xeroform left in place; extra bandages provided     Exercises - Seated Scapular Retraction  -  2-3 x daily - 7 x weekly - 1 sets - 20 reps - Wrist Flexion Extension AROM with Fingers Curled and Palm Down  - 2-3 x daily - 7 x weekly - 2 sets - 20 reps - Seated Elbow Flexion and Extension AROM  - 2-3 x daily - 7 x weekly - 2 sets - 15 reps - Putty Squeezes  - 8-10 x daily - 7 x weekly - 1 sets - 20 reps  Sleeping position, safety/protocol precautions, surgical technique and effects on rehab   PATIENT EDUCATION: Education details: signs of infection, cryotherapy, edema management, joint protection, diagnosis, prognosis, anatomy, exercise progression, DOMS expectations, muscle firing,  envelope of function, HEP, POC Person educated: Patient Education method: Explanation, Demonstration, Tactile cues, Verbal cues, and Handouts Education comprehension: verbalized understanding, returned demonstration, verbal cues required, tactile cues required, and needs further education     HOME EXERCISE PROGRAM:  Access Code: [email or web address] URL: [email or web address] Date: 06/02/2024 Prepared by: Silver Dross      ASSESSMENT:   CLINICAL IMPRESSION:   Good tolerance for PROM today with minimal muscle guarding. No complaints of discomfort with initiation of AAROM interventions. Trialled gentle isometrics with good performance and no c/o increased pain. Pt has MD f/u following PT session today. Pt is 2 weeks s/p and doing well. Instructed pt to keep using ice at home. Will continue to progress as tolerated with protocol. Eval: Patient is a 18 y.o. female who was seen today for physical therapy treatment s/p L inferior labral repair and remplissage. Pt has expected ROM deficits, strength deficits, and pain after recent surgery. Pt is functionally limited with ADL, self care, and exercise due to recent surgical intervention as expected. Pain is well managed at this time. Pt tolerated exercise review and PROM without difficulty. Plan to continue to progress per protocol. Pt would benefit from continued  skilled therapy in order to reach goals and maximize functional L UE strength and ROM for full return to PLOF.    OBJECTIVE IMPAIRMENTS decreased activity tolerance, decreased ROM, decreased strength, increased muscle spasms, impaired UE functional use, improper body mechanics, postural dysfunction, and pain.    ACTIVITY LIMITATIONS cleaning, lifting, bending, carry, dressing, bathing, feeding, community activity, meal prep, laundry, and yard work.    PERSONAL FACTORS Time since onset of injury/illness/exacerbation is also affecting patient's functional outcome.     REHAB POTENTIAL: Good   CLINICAL DECISION MAKING: Stable/uncomplicated   EVALUATION COMPLEXITY: Low     GOALS:     SHORT TERM GOALS: Target date: 07/14/2024       Pt will become independent with HEP in order to demonstrate synthesis of PT education.   Goal status: INITIAL   2.  Pt will be able to demonstrate full PROM  in order to demonstrate functional improvement in UE for progression to next phase of protocol.     Goal status: INITIAL   3.  Pt will report at least 2 pt reduction on NPRS scale for pain in order to demonstrate functional improvement with household activity, self care, and ADL.    Goal status: INITIAL       LONG TERM GOALS: Target date: 08/25/2024       Pt  will become independent with final HEP in order to demonstrate synthesis of PT education.   Goal status: INITIAL   2.  Pt will be  able to reach [facility] and carry/hold >25lbs in order to demonstrate functional improvement in L UE strength for return to PLOF and exercise.    Goal status: INITIAL   3.  Pt will be able to demonstrate full OH AROM of the L shoulder in order to demonstrate functional improvement in UE function for self-care and house hold duties.    Goal status: INITIAL   4.  Pt will test to within 80% HHD of R UE in order to progress to plyo loading of shoulder.     Goal status: INITIAL      PLAN: PT FREQUENCY:  1-2x/week   PT DURATION: 12 weeks   PLANNED INTERVENTIONS: Therapeutic exercises, Therapeutic activity, Neuromuscular re-education, Patient/Family education, Joint mobilization, Dry Needling, Spinal mobilization, Cryotherapy, Moist heat, Taping, and Manual therapy, Re-evaluation   PLAN FOR NEXT SESSION:  PROM,  avoid posterior shoulder stretching/crossbody adduction         Fronie Jewett Makalynn Berwanger, PTA 06/14/2024, 5:19 PM

## 2024-06-16 ENCOUNTER — Encounter (HOSPITAL_BASED_OUTPATIENT_CLINIC_OR_DEPARTMENT_OTHER)

## 2024-06-18 MED ORDER — BUPIVACAINE LIPOSOME 1.3 % IJ SUSP
INTRAMUSCULAR | Status: DC | PRN
  Administered 2024-05-30: 10 mL via PERINEURAL

## 2024-06-18 MED ORDER — BUPIVACAINE HCL (PF) 0.5 % IJ SOLN
INTRAMUSCULAR | Status: DC | PRN
  Administered 2024-05-30: 10 mL via PERINEURAL

## 2024-06-18 NOTE — Addendum Note (Signed)
 Addendum  created 06/18/24 2116 by Darlyn Rush, MD   Child order released for a procedure order, Clinical Note Signed, Intraprocedure Blocks edited, Intraprocedure Meds edited, SmartForm saved

## 2024-06-18 NOTE — Anesthesia Procedure Notes (Signed)
 Anesthesia Regional Block: Interscalene brachial plexus block   Pre-Anesthetic Checklist: , timeout performed,  Correct Patient, Correct Site, Correct Laterality,  Correct Procedure, Correct Position, site marked,  Risks and benefits discussed,  Surgical consent,  Pre-op evaluation,  At surgeon's request and post-op pain management  Laterality: Upper and Left  Prep: chloraprep       Needles:  Injection technique: Single-shot  Needle Type: Stimulator Needle - 40     Needle Length: 4cm  Needle Gauge: 22     Additional Needles:   Procedures:,,,, ultrasound used (permanent image in chart),,    Narrative:  Start time: 06/18/2024 10:03 AM End time: 06/18/2024 10:23 AM Injection made incrementally with aspirations every 5 mL.  Performed by: Personally  Anesthesiologist: Darlyn Rush, MD  Additional Notes: BP cuff, SpO2 and EKG monitors applied. Sedation begun. Nerve location verified with ultrasound. Anesthetic injected incrementally, slowly, and after neg aspirations under direct u/s guidance. Good perineural spread. Tolerated well.

## 2024-06-23 ENCOUNTER — Ambulatory Visit (HOSPITAL_BASED_OUTPATIENT_CLINIC_OR_DEPARTMENT_OTHER): Admitting: Physical Therapy

## 2024-06-23 ENCOUNTER — Encounter (HOSPITAL_BASED_OUTPATIENT_CLINIC_OR_DEPARTMENT_OTHER): Payer: Self-pay | Admitting: Physical Therapy

## 2024-06-23 DIAGNOSIS — M25512 Pain in left shoulder: Secondary | ICD-10-CM | POA: Diagnosis not present

## 2024-06-23 DIAGNOSIS — M6281 Muscle weakness (generalized): Secondary | ICD-10-CM

## 2024-06-23 NOTE — Therapy (Signed)
 OUTPATIENT PHYSICAL THERAPY UPPER EXTREMITY Treatment   Patient Name: Marissa [name] MRN: [id number] DOB:10-09-2006, 18 y.o., female Today's Date: 06/23/2024  END OF SESSION:  PT End of Session - 06/23/24 1052     Visit Number 4    Number of Visits 13    Date for PT Re-Evaluation 08/31/24    Authorization Type [place] Medicaid    Authorization Time Period 06/02/2024-08/31/2024    Authorization - Number of Visits 13    PT Start Time 1059    PT Stop Time 1130    PT Time Calculation (min) 31 min    Activity Tolerance Patient tolerated treatment well    Behavior During Therapy [facility] for tasks assessed/performed             Past Medical History:  Diagnosis Date   ADHD (attention deficit hyperactivity disorder)    Allergy    Phreesia 05/08/2021   Closed fracture of base of proximal phalanx of right thumb 06/04/2015   Salter-Harris type II Ortho: [facility] [place])    Nocturnal enuresis 03/13/2014   Shoulder subluxation, left, initial encounter 01/12/2022   Urinary urgency 08/14/2015   Past Surgical History:  Procedure Laterality Date   SHOULDER ARTHROSCOPY WITH LABRAL REPAIR Left 05/30/2024   SHOULDER ARTHROSCOPY WITH LABRAL REPAIR Left 05/30/2024   Procedure: ARTHROSCOPY, SHOULDER, WITH GLENOID LABRUM REPAIR;  Surgeon: Genelle Standing, MD;  Location: [facility];  Service: Orthopedics;  Laterality: Left;  LEFT SHOULDER ARTHROSCOPY WITH LABRAL REPAIR AND REMPLISSAGE   WISDOM TOOTH EXTRACTION     Patient Active Problem List   Diagnosis Date Noted   Shoulder instability, left 05/30/2024   Incontinence of urine 06/21/2023   Panic attacks 08/17/2022   Acne 05/10/2021   Body odor 05/10/2021   Osgood-Schlatter's disease of knees, bilateral 09/07/2016   History of snoring 08/27/2015   PTSD (post-traumatic stress disorder) 11/28/2013   Allergic rhinitis 09/20/2013   Eczema 09/20/2013   Adopted 09/20/2013    PCP: Leta Crazier,  MD   REFERRING PROVIDER:  Genelle Standing, MD     REFERRING DIAG:  [phone] (ICD-10-CM) - Shoulder instability, left      THERAPY DIAG:  Acute pain of left shoulder  Muscle weakness (generalized)  Rationale for Evaluation and Treatment: Rehabilitation  ONSET DATE: 05/30/24 DOS  Days since surgery: 24  PROCEDURE: 1.  Left shoulder inferior labral repair 2.  Left shoulder arthroscopic remplissage  SUBJECTIVE:  SUBJECTIVE STATEMENT:  Pt reports no pain since last session. Pt is compliant with HEP. No sling the last 2 days.    Eval: Pt has had very little pain post op. Pt is not able to feel the L shoulder area, denies NT or s/s infection and DVT. Pt has been using her sling but detached the straps last night and is unsure of how to put it back on. Pt has not changed surgical bandages. Pt has been icing with her machine. Pt is taking meds at directed. Has not had a bowel movement in last 3 days. Slept in chair for first 2 days and is now sleeping in bed.    PERTINENT HISTORY: PTSD  PAIN:  Are you having pain? 0/10: none right now at rest; has had pain into L shoulder surgical sites  Pain description: surgical pain and spasm Aggravating factors: movement, at baseline Relieving factors: rest, meds ice  PRECAUTIONS: Shoulder  RED FLAGS: None   WEIGHT BEARING RESTRICTIONS: Yes WNB L UE  FALLS:  Has patient fallen in last 6 months? No  LIVING ENVIRONMENT: Lives with: lives with their family Lives in: House/apartment Has following equipment at home: None  OCCUPATION: Consulting civil engineer- art major, plays rec sports, video gaming, reading, drawing for leisure  PLOF: Independent  PATIENT GOALS: return to normalized activity    OBJECTIVE:  Note: Objective measures were completed at Evaluation  unless otherwise noted.  DIAGNOSTIC FINDINGS:    IMPRESSION: 1. 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 No detached labral tear. 2. Partial-thickness chondral surface irregularity of the anteroinferior glenoid. 3. Small partial thickness interstitial tear of the distal supraspinatus tendon. No full-thickness or retracted rotator cuff tear. 4. Mild subacromial-subdeltoid bursitis.     Electronically Signed   By: Mabel Converse D.O.   On: 03/13/2024 18:24    PATIENT SURVEYS : Upper Extremity Function Scale: 65 / 80 = 81.3 %    COGNITION:           Overall cognitive status: Within functional limits for tasks assessed                                  SENSATION: WFL   UPPER EXTREMITY PROM:    Flexion to 30, ABD 30, IR  belly, ER 0   UPPER EXTREMITY MMT: unable to test due to surgical precautions    PALPATION:  Numbness around incision sites;   Incision sites: clean, dry, no erythema or drainage noted; surgical bandages in place   TODAY'S TREATMENT:    6/27  PROM to protocol limits- able to go further but held at this time  AAROM flexion 2x10 AAROM ABD to 45 2x10 Isometrics IR/ER 2x10 at wall 3s  Isometric ABD 2x10 Isometric ext 2x10  6/18 -PROM L shoulder -Supine wand press up 2x10 -Supine wand flexion 2x10 (to 90deg) -Scap squeezes 3 2x10 -Isometric flexion and abduction 5 x20ea    [facility] Adult PT Treatment:                                                DATE: 06/10/24 Therapeutic Exercise: Reviewed HEP. Max verbal and tactile cues for proper scapular retractions without compensations.  Manual Therapy: Changed dressing-minimal yellow drainage. No smell.  PROM L shoulder per protocol with PT verbal cues for relaxation which pt was able to perform with eyes closed.     Eval:  Bandage change; incision areas sanitized and portals covered with gauze and  Tegaderm; xeroform left in place; extra bandages provided     Exercises - Seated Scapular Retraction  - 2-3 x daily - 7 x weekly - 1 sets - 20 reps - Wrist Flexion Extension AROM with Fingers Curled and Palm Down  - 2-3 x daily - 7 x weekly - 2 sets - 20 reps - Seated Elbow Flexion and Extension AROM  - 2-3 x daily - 7 x weekly - 2 sets - 15 reps - Putty Squeezes  - 8-10 x daily - 7 x weekly - 1 sets - 20 reps  Sleeping position, safety/protocol precautions, surgical technique and effects on rehab   PATIENT EDUCATION: Education details: signs of infection, cryotherapy, edema management, joint protection, diagnosis, prognosis, anatomy, exercise progression, DOMS expectations, muscle firing,  envelope of function, HEP, POC Person educated: Patient Education method: Explanation, Demonstration, Tactile cues, Verbal cues, and Handouts Education comprehension: verbalized understanding, returned demonstration, verbal cues required, tactile cues required, and needs further education     HOME EXERCISE PROGRAM:  Access Code: [email or web address] URL: [email or web address] Date: 06/02/2024 Prepared by: Dale Call      ASSESSMENT:   CLINICAL IMPRESSION:   Pt 3 wks post op at this time. Pt is at protocol limits with ROM and likely able to go further without pain. Pt does present without sling and advised to continue to wear it for the next week.  Pt able to progress to [facility] and isometrics for HEP without concerns. Pt will 4 wks at next visit and may progress to AROM as tolerated per MD note. Pt would benefit from continued skilled therapy in order to reach goals and maximize functional L UE strength and ROM for full return to PLOF.   Eval: Patient is a 18 y.o. female who was seen today for physical therapy treatment s/p L inferior labral repair and remplissage. Pt has expected ROM deficits, strength deficits, and pain after recent surgery. Pt is functionally limited with ADL, self care, and  exercise due to recent surgical intervention as expected. Pain is well managed at this time. Pt tolerated exercise review and PROM without difficulty. Plan to continue to progress per protocol. Pt would benefit from continued skilled therapy in order to reach goals and maximize functional L UE strength and ROM for full return to PLOF.    OBJECTIVE IMPAIRMENTS decreased activity tolerance, decreased ROM, decreased strength, increased muscle spasms, impaired UE functional use, improper body mechanics, postural dysfunction, and pain.    ACTIVITY LIMITATIONS cleaning, lifting, bending, carry, dressing, bathing, feeding, community activity, meal prep, laundry, and yard work.    PERSONAL FACTORS Time since onset of injury/illness/exacerbation is also affecting patient's functional outcome.     REHAB POTENTIAL: Good   CLINICAL DECISION MAKING: Stable/uncomplicated   EVALUATION COMPLEXITY: Low     GOALS:     SHORT TERM GOALS: Target date: 07/14/2024       Pt will become independent with HEP in order to demonstrate synthesis of PT education.   Goal status: INITIAL   2.  Pt will be able to demonstrate full PROM  in order to demonstrate functional improvement in UE for progression to next phase of protocol.     Goal status: INITIAL   3.  Pt will report at least 2 pt reduction on NPRS  scale for pain in order to demonstrate functional improvement with household activity, self care, and ADL.    Goal status: INITIAL       LONG TERM GOALS: Target date: 08/25/2024       Pt  will become independent with final HEP in order to demonstrate synthesis of PT education.   Goal status: INITIAL   2.  Pt will be able to reach [facility] and carry/hold >25lbs in order to demonstrate functional improvement in L UE strength for return to PLOF and exercise.    Goal status: INITIAL   3.  Pt will be able to demonstrate full [place] AROM of the L shoulder in order to demonstrate functional improvement in UE function  for self-care and house hold duties.    Goal status: INITIAL   4.  Pt will test to within 80% HHD of R UE in order to progress to plyo loading of shoulder.     Goal status: INITIAL      PLAN: PT FREQUENCY: 1-2x/week   PT DURATION: 12 weeks   PLANNED INTERVENTIONS: Therapeutic exercises, Therapeutic activity, Neuromuscular re-education, Patient/Family education, Joint mobilization, Dry Needling, Spinal mobilization, Cryotherapy, Moist heat, Taping, and Manual therapy, Re-evaluation   PLAN FOR NEXT SESSION:  PROM,  avoid posterior shoulder stretching/crossbody adduction         Dale Call, PT 06/23/2024, 11:31 AM

## 2024-06-26 ENCOUNTER — Encounter (HOSPITAL_BASED_OUTPATIENT_CLINIC_OR_DEPARTMENT_OTHER): Payer: Self-pay | Admitting: Physical Therapy

## 2024-06-26 ENCOUNTER — Ambulatory Visit (HOSPITAL_BASED_OUTPATIENT_CLINIC_OR_DEPARTMENT_OTHER): Admitting: Physical Therapy

## 2024-06-26 DIAGNOSIS — M25512 Pain in left shoulder: Secondary | ICD-10-CM | POA: Diagnosis not present

## 2024-06-26 DIAGNOSIS — M6281 Muscle weakness (generalized): Secondary | ICD-10-CM

## 2024-06-26 NOTE — Therapy (Signed)
 OUTPATIENT PHYSICAL THERAPY UPPER EXTREMITY Treatment   Patient Name: Marissa [name] MRN: [id number] DOB:November 02, 2006, 18 y.o., female Today's Date: 06/26/2024  END OF SESSION:  PT End of Session - 06/26/24 1622     Visit Number 5    Number of Visits 13    Date for PT Re-Evaluation 08/31/24    Authorization Type [place] Medicaid    Authorization Time Period 06/02/2024-08/31/2024    Authorization - Visit Number 4    Authorization - Number of Visits 13    PT Start Time 1621   pt arrived late   PT Stop Time 1700    PT Time Calculation (min) 39 min    Activity Tolerance Patient tolerated treatment well    Behavior During Therapy [facility] for tasks assessed/performed              Past Medical History:  Diagnosis Date   ADHD (attention deficit hyperactivity disorder)    Allergy    Phreesia 05/08/2021   Closed fracture of base of proximal phalanx of right thumb 06/04/2015   Salter-Harris type II Ortho: Donnice Robinsons [facility] [place])    Nocturnal enuresis 03/13/2014   Shoulder subluxation, left, initial encounter 01/12/2022   Urinary urgency 08/14/2015   Past Surgical History:  Procedure Laterality Date   SHOULDER ARTHROSCOPY WITH LABRAL REPAIR Left 05/30/2024   SHOULDER ARTHROSCOPY WITH LABRAL REPAIR Left 05/30/2024   Procedure: ARTHROSCOPY, SHOULDER, WITH GLENOID LABRUM REPAIR;  Surgeon: Genelle Standing, MD;  Location: [facility];  Service: Orthopedics;  Laterality: Left;  LEFT SHOULDER ARTHROSCOPY WITH LABRAL REPAIR AND REMPLISSAGE   WISDOM TOOTH EXTRACTION     Patient Active Problem List   Diagnosis Date Noted   Shoulder instability, left 05/30/2024   Incontinence of urine 06/21/2023   Panic attacks 08/17/2022   Acne 05/10/2021   Body odor 05/10/2021   Osgood-Schlatter's disease of knees, bilateral 09/07/2016   History of snoring 08/27/2015   PTSD (post-traumatic stress disorder) 11/28/2013   Allergic rhinitis 09/20/2013   Eczema 09/20/2013    Adopted 09/20/2013    PCP: Leta Crazier, MD   REFERRING PROVIDER:  Genelle Standing, MD     REFERRING DIAG:  [phone] (ICD-10-CM) - Shoulder instability, left      THERAPY DIAG:  Acute pain of left shoulder  Muscle weakness (generalized)  Rationale for Evaluation and Treatment: Rehabilitation  ONSET DATE: 05/30/24 DOS  Days since surgery: 27  PROCEDURE: 1.  Left shoulder inferior labral repair 2.  Left shoulder arthroscopic remplissage  SUBJECTIVE:  SUBJECTIVE STATEMENT:  Pt denies pain, found sling.    Eval: Pt has had very little pain post op. Pt is not able to feel the L shoulder area, denies NT or s/s infection and DVT. Pt has been using her sling but detached the straps last night and is unsure of how to put it back on. Pt has not changed surgical bandages. Pt has been icing with her machine. Pt is taking meds at directed. Has not had a bowel movement in last 3 days. Slept in chair for first 2 days and is now sleeping in bed.    PERTINENT HISTORY: PTSD  PAIN:  Are you having pain? 0/10: none right now at rest; has had pain into L shoulder surgical sites  Pain description: surgical pain and spasm Aggravating factors: movement, at baseline Relieving factors: rest, meds ice  PRECAUTIONS: Shoulder  RED FLAGS: None   WEIGHT BEARING RESTRICTIONS: Yes WNB L UE  FALLS:  Has patient fallen in last 6 months? No  LIVING ENVIRONMENT: Lives with: lives with their family Lives in: House/apartment Has following equipment at home: None  OCCUPATION: Consulting civil engineer- art major, plays rec sports, video gaming, reading, drawing for leisure  PLOF: Independent  PATIENT GOALS: return to normalized activity    OBJECTIVE:  Note: Objective measures were completed at Evaluation unless otherwise  noted.  DIAGNOSTIC FINDINGS:    IMPRESSION: 1. 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 No detached labral tear. 2. Partial-thickness chondral surface irregularity of the anteroinferior glenoid. 3. Small partial thickness interstitial tear of the distal supraspinatus tendon. No full-thickness or retracted rotator cuff tear. 4. Mild subacromial-subdeltoid bursitis.     Electronically Signed   By: Mabel Converse D.O.   On: 03/13/2024 18:24    PATIENT SURVEYS : Upper Extremity Function Scale: 65 / 80 = 81.3 %    COGNITION:           Overall cognitive status: Within functional limits for tasks assessed                                  SENSATION: WFL   UPPER EXTREMITY PROM:    Flexion to 30, ABD 30, IR  belly, ER 0   UPPER EXTREMITY MMT: unable to test due to surgical precautions    PALPATION:  Numbness around incision sites;   Incision sites: clean, dry, no erythema or drainage noted; surgical bandages in place   TODAY'S TREATMENT:   6/30 MANUAL STM upper trap, levator, scalenes, subscap     Pin and stretch upper trap Supine AA & AROM flexion Rythmic stabs at 90 flx Upper trap stretch with sheet for first rib depression AROM in mirror full range Prone retraction, +ext, +flexion   6/27  PROM to protocol limits- able to go further but held at this time  AAROM flexion 2x10 AAROM ABD to 45 2x10 Isometrics IR/ER 2x10 at wall 3s  Isometric ABD 2x10 Isometric ext 2x10  6/18 -PROM L shoulder -Supine wand press up 2x10 -Supine wand flexion 2x10 (to 90deg) -Scap squeezes 3 2x10 -Isometric flexion and abduction 5 x20ea      PATIENT EDUCATION: Education details: signs of infection, cryotherapy, edema management, joint protection, diagnosis, prognosis, anatomy, exercise progression, DOMS expectations, muscle firing,  envelope of function, HEP, POC Person  educated: Patient Education method: Explanation, Demonstration, Tactile cues, Verbal cues, and Handouts Education  comprehension: verbalized understanding, returned demonstration, verbal cues required, tactile cues required, and needs further education     HOME EXERCISE PROGRAM:  Access Code: [email or web address] URL: [email or web address] Date: 06/02/2024 Prepared by: Dale Call      ASSESSMENT:   CLINICAL IMPRESSION:   Progressed to AROM and pt tolerated very well. Discussed the importance of avoiding pain/pinching/stabbing but she did not experience any of those symptoms. Notable scapular dyskinesia Rt>Lt, added prone activation for strength and control.   Eval: Patient is a 18 y.o. female who was seen today for physical therapy treatment s/p L inferior labral repair and remplissage. Pt has expected ROM deficits, strength deficits, and pain after recent surgery. Pt is functionally limited with ADL, self care, and exercise due to recent surgical intervention as expected. Pain is well managed at this time. Pt tolerated exercise review and PROM without difficulty. Plan to continue to progress per protocol. Pt would benefit from continued skilled therapy in order to reach goals and maximize functional L UE strength and ROM for full return to PLOF.    OBJECTIVE IMPAIRMENTS decreased activity tolerance, decreased ROM, decreased strength, increased muscle spasms, impaired UE functional use, improper body mechanics, postural dysfunction, and pain.    ACTIVITY LIMITATIONS cleaning, lifting, bending, carry, dressing, bathing, feeding, community activity, meal prep, laundry, and yard work.    PERSONAL FACTORS Time since onset of injury/illness/exacerbation is also affecting patient's functional outcome.     REHAB POTENTIAL: Good   CLINICAL DECISION MAKING: Stable/uncomplicated   EVALUATION COMPLEXITY: Low     GOALS:     SHORT TERM GOALS: Target date: 07/14/2024       Pt will become  independent with HEP in order to demonstrate synthesis of PT education.   Goal status: INITIAL   2.  Pt will be able to demonstrate full PROM  in order to demonstrate functional improvement in UE for progression to next phase of protocol.     Goal status: INITIAL   3.  Pt will report at least 2 pt reduction on NPRS scale for pain in order to demonstrate functional improvement with household activity, self care, and ADL.    Goal status: INITIAL       LONG TERM GOALS: Target date: 08/25/2024       Pt  will become independent with final HEP in order to demonstrate synthesis of PT education.   Goal status: INITIAL   2.  Pt will be able to reach [facility] and carry/hold >25lbs in order to demonstrate functional improvement in L UE strength for return to PLOF and exercise.    Goal status: INITIAL   3.  Pt will be able to demonstrate full OH AROM of the L shoulder in order to demonstrate functional improvement in UE function for self-care and house hold duties.    Goal status: INITIAL   4.  Pt will test to within 80% HHD of R UE in order to progress to plyo loading of shoulder.     Goal status: INITIAL      PLAN: PT FREQUENCY: 1-2x/week   PT DURATION: 12 weeks   PLANNED INTERVENTIONS: Therapeutic exercises, Therapeutic activity, Neuromuscular re-education, Patient/Family education, Joint mobilization, Dry Needling, Spinal mobilization, Cryotherapy, Moist heat, Taping, and Manual therapy, Re-evaluation   PLAN FOR NEXT SESSION:  PROM,  avoid posterior shoulder stretching/crossbody adduction         Harlene Cordon, PT, DPT 06/26/2024, 5:01 PM

## 2024-06-28 ENCOUNTER — Encounter (HOSPITAL_BASED_OUTPATIENT_CLINIC_OR_DEPARTMENT_OTHER)

## 2024-06-29 ENCOUNTER — Encounter (HOSPITAL_BASED_OUTPATIENT_CLINIC_OR_DEPARTMENT_OTHER): Payer: Self-pay | Admitting: Physical Therapy

## 2024-06-29 ENCOUNTER — Ambulatory Visit (HOSPITAL_BASED_OUTPATIENT_CLINIC_OR_DEPARTMENT_OTHER): Attending: Orthopaedic Surgery | Admitting: Physical Therapy

## 2024-06-29 DIAGNOSIS — M6281 Muscle weakness (generalized): Secondary | ICD-10-CM | POA: Diagnosis present

## 2024-06-29 DIAGNOSIS — M25512 Pain in left shoulder: Secondary | ICD-10-CM | POA: Insufficient documentation

## 2024-06-29 NOTE — Therapy (Signed)
 OUTPATIENT PHYSICAL THERAPY UPPER EXTREMITY Treatment   Patient Name: Marissa [name] MRN: [id number] DOB:2006-12-12, 18 y.o., female Today's Date: 06/29/2024  END OF SESSION:  PT End of Session - 06/29/24 1302     Visit Number 6    Number of Visits 13    Date for PT Re-Evaluation 08/31/24    Authorization Type [place] Medicaid    Authorization Time Period 06/02/2024-08/31/2024    Authorization - Number of Visits 13    PT Start Time 1300    PT Stop Time 1338    PT Time Calculation (min) 38 min    Activity Tolerance Patient tolerated treatment well    Behavior During Therapy [facility] for tasks assessed/performed              Past Medical History:  Diagnosis Date   ADHD (attention deficit hyperactivity disorder)    Allergy    Phreesia 05/08/2021   Closed fracture of base of proximal phalanx of right thumb 06/04/2015   Salter-Harris type II Ortho: Donnice Robinsons [facility] [place])    Nocturnal enuresis 03/13/2014   Shoulder subluxation, left, initial encounter 01/12/2022   Urinary urgency 08/14/2015   Past Surgical History:  Procedure Laterality Date   SHOULDER ARTHROSCOPY WITH LABRAL REPAIR Left 05/30/2024   SHOULDER ARTHROSCOPY WITH LABRAL REPAIR Left 05/30/2024   Procedure: ARTHROSCOPY, SHOULDER, WITH GLENOID LABRUM REPAIR;  Surgeon: Genelle Standing, MD;  Location: [facility];  Service: Orthopedics;  Laterality: Left;  LEFT SHOULDER ARTHROSCOPY WITH LABRAL REPAIR AND REMPLISSAGE   WISDOM TOOTH EXTRACTION     Patient Active Problem List   Diagnosis Date Noted   Shoulder instability, left 05/30/2024   Incontinence of urine 06/21/2023   Panic attacks 08/17/2022   Acne 05/10/2021   Body odor 05/10/2021   Osgood-Schlatter's disease of knees, bilateral 09/07/2016   History of snoring 08/27/2015   PTSD (post-traumatic stress disorder) 11/28/2013   Allergic rhinitis 09/20/2013   Eczema 09/20/2013   Adopted 09/20/2013    PCP: Leta Crazier,  MD   REFERRING PROVIDER:  Genelle Standing, MD     REFERRING DIAG:  [phone] (ICD-10-CM) - Shoulder instability, left      THERAPY DIAG:  Acute pain of left shoulder  Muscle weakness (generalized)  Rationale for Evaluation and Treatment: Rehabilitation  ONSET DATE: 05/30/24 DOS  Days since surgery: 30  PROCEDURE: 1.  Left shoulder inferior labral repair 2.  Left shoulder arthroscopic remplissage  SUBJECTIVE:  SUBJECTIVE STATEMENT:  Sore after last session, denies n/T or HA   Eval: Pt has had very little pain post op. Pt is not able to feel the L shoulder area, denies NT or s/s infection and DVT. Pt has been using her sling but detached the straps last night and is unsure of how to put it back on. Pt has not changed surgical bandages. Pt has been icing with her machine. Pt is taking meds at directed. Has not had a bowel movement in last 3 days. Slept in chair for first 2 days and is now sleeping in bed.    PERTINENT HISTORY: PTSD  PAIN:  Are you having pain? 0/10: none right now at rest; has had pain into L shoulder surgical sites  Pain description: surgical pain and spasm Aggravating factors: movement, at baseline Relieving factors: rest, meds ice  PRECAUTIONS: Shoulder  RED FLAGS: None   WEIGHT BEARING RESTRICTIONS: Yes WNB L UE  FALLS:  Has patient fallen in last 6 months? No  LIVING ENVIRONMENT: Lives with: lives with their family Lives in: House/apartment Has following equipment at home: None  OCCUPATION: Consulting civil engineer- art major, plays rec sports, video gaming, reading, drawing for leisure  PLOF: Independent  PATIENT GOALS: return to normalized activity    OBJECTIVE:  Note: Objective measures were completed at Evaluation unless otherwise noted.  DIAGNOSTIC FINDINGS:     IMPRESSION: 1. 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 No detached labral tear. 2. Partial-thickness chondral surface irregularity of the anteroinferior glenoid. 3. Small partial thickness interstitial tear of the distal supraspinatus tendon. No full-thickness or retracted rotator cuff tear. 4. Mild subacromial-subdeltoid bursitis.     Electronically Signed   By: Mabel Converse D.O.   On: 03/13/2024 18:24    PATIENT SURVEYS : Upper Extremity Function Scale: 65 / 80 = 81.3 %    COGNITION:           Overall cognitive status: Within functional limits for tasks assessed                                  SENSATION: WFL   UPPER EXTREMITY PROM:    Flexion to 30, ABD 30, IR  belly, ER 0   UPPER EXTREMITY MMT: unable to test due to surgical precautions    PALPATION:  Numbness around incision sites;   Incision sites: clean, dry, no erythema or drainage noted; surgical bandages in place   TODAY'S TREATMENT:   7/3 MANUAL STM upper trap, levator; cervical sidebend + lateral joint mobs Sidelying ER, reach/pull to 90 deg flexion, horiz abduction, abduction- to 90 & full range Prone row, triceps, ext +superman  6/30 MANUAL STM upper trap, levator, scalenes, subscap     Pin and stretch upper trap Supine AA & AROM flexion Rythmic stabs at 90 flx Upper trap stretch with sheet for first rib depression AROM in mirror full range Prone retraction, +ext, +flexion   6/27  PROM to protocol limits- able to go further but held at this time  AAROM flexion 2x10 AAROM ABD to 45 2x10 Isometrics IR/ER 2x10 at wall 3s  Isometric ABD 2x10 Isometric ext 2x10  6/18 -PROM L shoulder -Supine wand press up 2x10 -Supine wand flexion 2x10 (to 90deg) -Scap squeezes 3 2x10 -Isometric flexion and abduction 5 x20ea      PATIENT EDUCATION: Education details: signs of infection,  cryotherapy, edema management, joint protection, diagnosis, prognosis, anatomy, exercise progression, DOMS expectations, muscle firing,  envelope of function, HEP, POC Person educated: Patient Education method: Explanation, Demonstration, Tactile cues, Verbal cues, and Handouts Education comprehension: verbalized understanding, returned demonstration, verbal cues required, tactile cues required, and needs further education     HOME EXERCISE PROGRAM:  Access Code: [email or web address] URL: [email or web address] Date: 06/02/2024 Prepared by: Dale Call      ASSESSMENT:   CLINICAL IMPRESSION:   Cues required for scauplar retraction in sidelying but pt improved with repetition. Full ROM available passively & actively. Fatigue noted with exercise but able to maintain good form.   Eval: Patient is a 18 y.o. female who was seen today for physical therapy treatment s/p L inferior labral repair and remplissage. Pt has expected ROM deficits, strength deficits, and pain after recent surgery. Pt is functionally limited with ADL, self care, and exercise due to recent surgical intervention as expected. Pain is well managed at this time. Pt tolerated exercise review and PROM without difficulty. Plan to continue to progress per protocol. Pt would benefit from continued skilled therapy in order to reach goals and maximize functional L UE strength and ROM for full return to PLOF.    OBJECTIVE IMPAIRMENTS decreased activity tolerance, decreased ROM, decreased strength, increased muscle spasms, impaired UE functional use, improper body mechanics, postural dysfunction, and pain.    ACTIVITY LIMITATIONS cleaning, lifting, bending, carry, dressing, bathing, feeding, community activity, meal prep, laundry, and yard work.    PERSONAL FACTORS Time since onset of injury/illness/exacerbation is also affecting patient's functional outcome.     REHAB POTENTIAL: Good   CLINICAL DECISION MAKING:  Stable/uncomplicated   EVALUATION COMPLEXITY: Low     GOALS:     SHORT TERM GOALS: Target date: 07/14/2024       Pt will become independent with HEP in order to demonstrate synthesis of PT education.   Goal status: met   2.  Pt will be able to demonstrate full PROM  in order to demonstrate functional improvement in UE for progression to next phase of protocol.     Goal status: met   3.  Pt will report at least 2 pt reduction on NPRS scale for pain in order to demonstrate functional improvement with household activity, self care, and ADL.    Goal status: met       LONG TERM GOALS: Target date: 08/25/2024       Pt  will become independent with final HEP in order to demonstrate synthesis of PT education.   Goal status: INITIAL   2.  Pt will be able to reach [facility] and carry/hold >25lbs in order to demonstrate functional improvement in L UE strength for return to PLOF and exercise.    Goal status: INITIAL   3.  Pt will be able to demonstrate full [place] AROM of the L shoulder in order to demonstrate functional improvement in UE function for self-care and house hold duties.    Goal status: INITIAL   4.  Pt will test to within 80% HHD of R UE in order to progress to plyo loading of shoulder.     Goal status: INITIAL      PLAN: PT FREQUENCY: 1-2x/week   PT DURATION: 12 weeks   PLANNED INTERVENTIONS: Therapeutic exercises, Therapeutic activity, Neuromuscular re-education, Patient/Family education, Joint mobilization, Dry Needling, Spinal mobilization, Cryotherapy, Moist heat, Taping, and Manual therapy, Re-evaluation   PLAN FOR NEXT SESSION:  PROM,  avoid posterior shoulder stretching/crossbody adduction  Chaylee Ehrsam, PT, DPT 06/29/2024, 1:39 PM

## 2024-07-03 ENCOUNTER — Ambulatory Visit (HOSPITAL_BASED_OUTPATIENT_CLINIC_OR_DEPARTMENT_OTHER)

## 2024-07-03 ENCOUNTER — Encounter (HOSPITAL_BASED_OUTPATIENT_CLINIC_OR_DEPARTMENT_OTHER): Payer: Self-pay

## 2024-07-03 DIAGNOSIS — M25512 Pain in left shoulder: Secondary | ICD-10-CM | POA: Diagnosis not present

## 2024-07-03 DIAGNOSIS — M6281 Muscle weakness (generalized): Secondary | ICD-10-CM

## 2024-07-03 NOTE — Therapy (Signed)
 OUTPATIENT PHYSICAL THERAPY UPPER EXTREMITY Treatment   Patient Name: Marissa [name] MRN: [id number] DOB:03/15/2006, 18 y.o., female Today's Date: 07/03/2024  END OF SESSION:  PT End of Session - 07/03/24 1602     Visit Number 7    Number of Visits 13    Date for PT Re-Evaluation 08/31/24    Authorization Type [place] [employer]    Authorization Time Period 06/02/2024-08/31/2024    Authorization - Visit Number 5    Authorization - Number of Visits 13    PT Start Time 1606    PT Stop Time 1645    PT Time Calculation (min) 39 min    Activity Tolerance Patient tolerated treatment well    Behavior During Therapy [facility] for tasks assessed/performed               Past Medical History:  Diagnosis Date   ADHD (attention deficit hyperactivity disorder)    Allergy    Phreesia 05/08/2021   Closed fracture of base of proximal phalanx of right thumb 06/04/2015   Salter-Harris type II Ortho: Donnice Robinsons [facility] [place])    Nocturnal enuresis 03/13/2014   Shoulder subluxation, left, initial encounter 01/12/2022   Urinary urgency 08/14/2015   Past Surgical History:  Procedure Laterality Date   SHOULDER ARTHROSCOPY WITH LABRAL REPAIR Left 05/30/2024   SHOULDER ARTHROSCOPY WITH LABRAL REPAIR Left 05/30/2024   Procedure: ARTHROSCOPY, SHOULDER, WITH GLENOID LABRUM REPAIR;  Surgeon: Genelle Standing, MD;  Location: [facility];  Service: Orthopedics;  Laterality: Left;  LEFT SHOULDER ARTHROSCOPY WITH LABRAL REPAIR AND REMPLISSAGE   WISDOM TOOTH EXTRACTION     Patient Active Problem List   Diagnosis Date Noted   Shoulder instability, left 05/30/2024   Incontinence of urine 06/21/2023   Panic attacks 08/17/2022   Acne 05/10/2021   Body odor 05/10/2021   Osgood-Schlatter's disease of knees, bilateral 09/07/2016   History of snoring 08/27/2015   PTSD (post-traumatic stress disorder) 11/28/2013   Allergic rhinitis 09/20/2013   Eczema 09/20/2013   Adopted  09/20/2013    PCP: Leta Crazier, MD   REFERRING PROVIDER:  Genelle Standing, MD     REFERRING DIAG:  [phone] (ICD-10-CM) - Shoulder instability, left      THERAPY DIAG:  Acute pain of left shoulder  Muscle weakness (generalized)  Rationale for Evaluation and Treatment: Rehabilitation  ONSET DATE: 05/30/24 DOS  Days since surgery: 34  PROCEDURE: 1.  Left shoulder inferior labral repair 2.  Left shoulder arthroscopic remplissage  SUBJECTIVE:  SUBJECTIVE STATEMENT:  Mild soreness after last session. Feels the new stretches have really helped. Has bene trying to work on her posture.    Eval: Pt has had very little pain post op. Pt is not able to feel the L shoulder area, denies NT or s/s infection and DVT. Pt has been using her sling but detached the straps last night and is unsure of how to put it back on. Pt has not changed surgical bandages. Pt has been icing with her machine. Pt is taking meds at directed. Has not had a bowel movement in last 3 days. Slept in chair for first 2 days and is now sleeping in bed.    PERTINENT HISTORY: PTSD  PAIN:  Are you having pain? 0/10: none right now at rest; has had pain into L shoulder surgical sites  Pain description: surgical pain and spasm Aggravating factors: movement, at baseline Relieving factors: rest, meds ice  PRECAUTIONS: Shoulder  RED FLAGS: None   WEIGHT BEARING RESTRICTIONS: Yes WNB L UE  FALLS:  Has patient fallen in last 6 months? No  LIVING ENVIRONMENT: Lives with: lives with their family Lives in: House/apartment Has following equipment at home: None  OCCUPATION: Consulting civil engineer- art major, plays rec sports, video gaming, reading, drawing for leisure  PLOF: Independent  PATIENT GOALS: return to normalized activity     OBJECTIVE:  Note: Objective measures were completed at Evaluation unless otherwise noted.  DIAGNOSTIC FINDINGS:    IMPRESSION: 1. 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 No detached labral tear. 2. Partial-thickness chondral surface irregularity of the anteroinferior glenoid. 3. Small partial thickness interstitial tear of the distal supraspinatus tendon. No full-thickness or retracted rotator cuff tear. 4. Mild subacromial-subdeltoid bursitis.     Electronically Signed   By: Mabel Converse D.O.   On: 03/13/2024 18:24    PATIENT SURVEYS : Upper Extremity Function Scale: 65 / 80 = 81.3 %    COGNITION:           Overall cognitive status: Within functional limits for tasks assessed                                  SENSATION: WFL   UPPER EXTREMITY PROM:    Flexion to 30, ABD 30, IR  belly, ER 0   UPPER EXTREMITY MMT: unable to test due to surgical precautions    PALPATION:  Numbness around incision sites;   Incision sites: clean, dry, no erythema or drainage noted; surgical bandages in place   TODAY'S TREATMENT:   7/7 PROM L shoulder Scap squeezes 5 x20 Supine rhythmic stabilization at 90deg  Supine wand flexion 2x10 Supine active flexion 2x10 Sidelying ER 2x10 Sidelying abduction 2x10 Seated press out 2x10 Standing active flexion in front of mirror 2x5 Standing abduction in front of mirror 2x5 Bent over row with cues for scap engagement 2x15       7/3 MANUAL STM upper trap, levator; cervical sidebend + lateral joint mobs Sidelying ER, reach/pull to 90 deg flexion, horiz abduction, abduction- to 90 & full range Prone row, triceps, ext +superman  6/30 MANUAL STM upper trap, levator, scalenes, subscap     Pin and stretch upper trap Supine AA & AROM flexion Rythmic stabs at 90 flx Upper trap stretch with sheet for first rib depression AROM in mirror  full range Prone retraction, +ext, +flexion  6/27  PROM to protocol limits- able to go further but held at this time  AAROM flexion 2x10 AAROM ABD to 45 2x10 Isometrics IR/ER 2x10 at wall 3s  Isometric ABD 2x10 Isometric ext 2x10  6/18 -PROM L shoulder -Supine wand press up 2x10 -Supine wand flexion 2x10 (to 90deg) -Scap squeezes 3 2x10 -Isometric flexion and abduction 5 x20ea      PATIENT EDUCATION: Education details: signs of infection, cryotherapy, edema management, joint protection, diagnosis, prognosis, anatomy, exercise progression, DOMS expectations, muscle firing,  envelope of function, HEP, POC Person educated: Patient Education method: Explanation, Demonstration, Tactile cues, Verbal cues, and Handouts Education comprehension: verbalized understanding, returned demonstration, verbal cues required, tactile cues required, and needs further education     HOME EXERCISE PROGRAM:  Access Code: [email or web address] URL: [email or web address] Date: 06/02/2024 Prepared by: Dale Call      ASSESSMENT:   CLINICAL IMPRESSION:   Pt now 5 weeks s/p. Notable improvements in soft tissue quality in UT area compared to previous sessions. Minimal tenderness here today with palpation. Overall good available PROM with some tightness noted in end ranges. Continued to progress AAROM and AROM activities. Lacking full AROM against gravity, as expected at this phase. No c/o pain with exercises today. Pt has to reschedule f/u with [facility].  Eval: Patient is a 18 y.o. female who was seen today for physical therapy treatment s/p L inferior labral repair and remplissage. Pt has expected ROM deficits, strength deficits, and pain after recent surgery. Pt is functionally limited with ADL, self care, and exercise due to recent surgical intervention as expected. Pain is well managed at this time. Pt tolerated exercise review and PROM without difficulty. Plan to continue to progress per  protocol. Pt would benefit from continued skilled therapy in order to reach goals and maximize functional L UE strength and ROM for full return to PLOF.    OBJECTIVE IMPAIRMENTS decreased activity tolerance, decreased ROM, decreased strength, increased muscle spasms, impaired UE functional use, improper body mechanics, postural dysfunction, and pain.    ACTIVITY LIMITATIONS cleaning, lifting, bending, carry, dressing, bathing, feeding, community activity, meal prep, laundry, and yard work.    PERSONAL FACTORS Time since onset of injury/illness/exacerbation is also affecting patient's functional outcome.     REHAB POTENTIAL: Good   CLINICAL DECISION MAKING: Stable/uncomplicated   EVALUATION COMPLEXITY: Low     GOALS:     SHORT TERM GOALS: Target date: 07/14/2024       Pt will become independent with HEP in order to demonstrate synthesis of PT education.   Goal status: met   2.  Pt will be able to demonstrate full PROM  in order to demonstrate functional improvement in UE for progression to next phase of protocol.     Goal status: met   3.  Pt will report at least 2 pt reduction on NPRS scale for pain in order to demonstrate functional improvement with household activity, self care, and ADL.    Goal status: met       LONG TERM GOALS: Target date: 08/25/2024       Pt  will become independent with final HEP in order to demonstrate synthesis of PT education.   Goal status: INITIAL   2.  Pt will be able to reach [facility] and carry/hold >25lbs in order to demonstrate functional improvement in L UE strength for return to PLOF and exercise.    Goal status: INITIAL   3.  Pt will be able to demonstrate full OH AROM of  the L shoulder in order to demonstrate functional improvement in UE function for self-care and house hold duties.    Goal status: INITIAL   4.  Pt will test to within 80% HHD of R UE in order to progress to plyo loading of shoulder.     Goal status: INITIAL       PLAN: PT FREQUENCY: 1-2x/week   PT DURATION: 12 weeks   PLANNED INTERVENTIONS: Therapeutic exercises, Therapeutic activity, Neuromuscular re-education, Patient/Family education, Joint mobilization, Dry Needling, Spinal mobilization, Cryotherapy, Moist heat, Taping, and Manual therapy, Re-evaluation   PLAN FOR NEXT SESSION:  PROM,  avoid posterior shoulder stretching/crossbody adduction         Asberry BRAVO Raffaella Edison, PTA 07/03/2024, 4:54 PM

## 2024-07-05 ENCOUNTER — Encounter (HOSPITAL_BASED_OUTPATIENT_CLINIC_OR_DEPARTMENT_OTHER): Admitting: Physical Therapy

## 2024-07-07 ENCOUNTER — Ambulatory Visit (HOSPITAL_BASED_OUTPATIENT_CLINIC_OR_DEPARTMENT_OTHER)

## 2024-07-08 ENCOUNTER — Ambulatory Visit (HOSPITAL_BASED_OUTPATIENT_CLINIC_OR_DEPARTMENT_OTHER): Admitting: Physical Therapy

## 2024-07-08 ENCOUNTER — Encounter (HOSPITAL_BASED_OUTPATIENT_CLINIC_OR_DEPARTMENT_OTHER): Payer: Self-pay | Admitting: Physical Therapy

## 2024-07-08 DIAGNOSIS — M25512 Pain in left shoulder: Secondary | ICD-10-CM

## 2024-07-08 DIAGNOSIS — M6281 Muscle weakness (generalized): Secondary | ICD-10-CM

## 2024-07-08 NOTE — Therapy (Signed)
 OUTPATIENT PHYSICAL THERAPY UPPER EXTREMITY Treatment  PHYSICAL THERAPY DISCHARGE SUMMARY  Visits from Start of Care: 8  Plan: Patient agrees to discharge.  Patient goals were not met. Patient is being discharged due to switching therapy clinics closer to home.       Patient Name: Marissa [name] MRN: [id number] DOB:[birth year], 18 y.o., female Today's Date: 07/08/2024  END OF SESSION:  PT End of Session - 07/08/24 1043     Visit Number 8    Number of Visits 13    Date for PT Re-Evaluation 08/31/24    Authorization Type [place] Medicaid    Authorization Time Period 06/02/2024-08/31/2024    Authorization - Visit Number 6    Authorization - Number of Visits 13    PT Start Time 1045    PT Stop Time 1128    PT Time Calculation (min) 43 min    Activity Tolerance Patient tolerated treatment well    Behavior During Therapy [facility] for tasks assessed/performed                Past Medical History:  Diagnosis Date   ADHD (attention deficit hyperactivity disorder)    Allergy    Phreesia 05/08/2021   Closed fracture of base of proximal phalanx of right thumb 06/04/2015   Salter-Harris type II Ortho: Donnice Robinsons [facility] [place])    Nocturnal enuresis 03/13/2014   Shoulder subluxation, left, initial encounter 01/12/2022   Urinary urgency 08/14/2015   Past Surgical History:  Procedure Laterality Date   SHOULDER ARTHROSCOPY WITH LABRAL REPAIR Left 05/30/2024   SHOULDER ARTHROSCOPY WITH LABRAL REPAIR Left 05/30/2024   Procedure: ARTHROSCOPY, SHOULDER, WITH GLENOID LABRUM REPAIR;  Surgeon: Genelle Standing, MD;  Location: [facility];  Service: Orthopedics;  Laterality: Left;  LEFT SHOULDER ARTHROSCOPY WITH LABRAL REPAIR AND REMPLISSAGE   WISDOM TOOTH EXTRACTION     Patient Active Problem List   Diagnosis Date Noted   Shoulder instability, left 05/30/2024   Incontinence of urine 06/21/2023   Panic attacks 08/17/2022   Acne 05/10/2021   Body odor  05/10/2021   Osgood-Schlatter's disease of knees, bilateral 09/07/2016   History of snoring 08/27/2015   PTSD (post-traumatic stress disorder) 11/28/2013   Allergic rhinitis 09/20/2013   Eczema 09/20/2013   Adopted 09/20/2013    PCP: Leta Crazier, MD   REFERRING PROVIDER:  Genelle Standing, MD     REFERRING DIAG:  [phone] (ICD-10-CM) - Shoulder instability, left      THERAPY DIAG:  Acute pain of left shoulder  Muscle weakness (generalized)  Rationale for Evaluation and Treatment: Rehabilitation  ONSET DATE: 05/30/24 DOS  Days since surgery: 39  PROCEDURE: 1.  Left shoulder inferior labral repair 2.  Left shoulder arthroscopic remplissage  SUBJECTIVE:  SUBJECTIVE STATEMENT:  Mild soreness after last session. Feels the new stretches have really helped. Has bene trying to work on her posture.    Eval: Pt has had very little pain post op. Pt is not able to feel the L shoulder area, denies NT or s/s infection and DVT. Pt has been using her sling but detached the straps last night and is unsure of how to put it back on. Pt has not changed surgical bandages. Pt has been icing with her machine. Pt is taking meds at directed. Has not had a bowel movement in last 3 days. Slept in chair for first 2 days and is now sleeping in bed.    PERTINENT HISTORY: PTSD  PAIN:  Are you having pain? 0/10: none right now at rest; has had pain into L shoulder surgical sites  Pain description: surgical pain and spasm Aggravating factors: movement, at baseline Relieving factors: rest, meds ice  PRECAUTIONS: Shoulder  RED FLAGS: None   WEIGHT BEARING RESTRICTIONS: Yes WNB L UE  FALLS:  Has patient fallen in last 6 months? No  LIVING ENVIRONMENT: Lives with: lives with their family Lives in:  House/apartment Has following equipment at home: None  OCCUPATION: Consulting civil engineer- art major, plays rec sports, video gaming, reading, drawing for leisure  PLOF: Independent  PATIENT GOALS: return to normalized activity    OBJECTIVE:  Note: Objective measures were completed at Evaluation unless otherwise noted.  DIAGNOSTIC FINDINGS:    IMPRESSION: 1. 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 No detached labral tear. 2. Partial-thickness chondral surface irregularity of the anteroinferior glenoid. 3. Small partial thickness interstitial tear of the distal supraspinatus tendon. No full-thickness or retracted rotator cuff tear. 4. Mild subacromial-subdeltoid bursitis.     Electronically Signed   By: Mabel Converse D.O.   On: 03/13/2024 18:24    PATIENT SURVEYS : Upper Extremity Function Scale: 65 / 80 = 81.3 %    COGNITION:           Overall cognitive status: Within functional limits for tasks assessed                                  SENSATION: WFL   UPPER EXTREMITY PROM:    Flexion to 30, ABD 30, IR  belly, ER 0   UPPER EXTREMITY MMT: unable to test due to surgical precautions    PALPATION:  Numbness around incision sites;   Incision sites: clean, dry, no erythema or drainage noted; surgical bandages in place   TODAY'S TREATMENT:  07/08/24  7/7 PROM L shoulder Scap squeezes 5 x20 Supine rhythmic stabilization at 90deg with serratus punches  Supine wand flexion 2x10 Supine active flexion 2x10 Seated ER 2x10 Sidelying abduction 2x10 Seated press serratus punches 2x10 Standing active flexion in front of mirror 2x5 Standing abduction in front of mirror 2x5 Bent over row with cues for scap engagement 2x15 Ball taps with weighted ball on wall at 80 degrees ER.  Prone T, Y 2x10   7/3 MANUAL STM upper trap, levator; cervical sidebend + lateral joint mobs Sidelying  ER, reach/pull to 90 deg flexion, horiz abduction, abduction- to 90 & full range Prone row, triceps, ext +superman  6/30 MANUAL STM upper trap, levator, scalenes, subscap     Pin and stretch upper trap Supine AA & AROM flexion Rythmic stabs at 90 flx Upper trap  stretch with sheet for first rib depression AROM in mirror full range Prone retraction, +ext, +flexion   6/27  PROM to protocol limits- able to go further but held at this time  AAROM flexion 2x10 AAROM ABD to 45 2x10 Isometrics IR/ER 2x10 at wall 3s  Isometric ABD 2x10 Isometric ext 2x10  6/18 -PROM L shoulder -Supine wand press up 2x10 -Supine wand flexion 2x10 (to 90deg) -Scap squeezes 3 2x10 -Isometric flexion and abduction 5 x20ea      PATIENT EDUCATION: Education details: signs of infection, cryotherapy, edema management, joint protection, diagnosis, prognosis, anatomy, exercise progression, DOMS expectations, muscle firing,  envelope of function, HEP, POC Person educated: Patient Education method: Explanation, Demonstration, Tactile cues, Verbal cues, and Handouts Education comprehension: verbalized understanding, returned demonstration, verbal cues required, tactile cues required, and needs further education     HOME EXERCISE PROGRAM:  Access Code: [email or web address] URL: [email or web address] Date: 06/02/2024 Prepared by: Dale Call      ASSESSMENT:   CLINICAL IMPRESSION:   Pt now 5 weeks and 4 days s/p. No tenderness here today with palpation. Overall good available PROM with some tightness noted in end ranges. Continued to progress AAROM and AROM activities. Lacking full AROM against gravity, as expected at this phase. No c/o pain with exercise progressions today. Pt rescheduled with [facility]. She is no longer able to come to the [facility] location due to her mom not driving her. She is going to look at rescheduling appts closer to home in [place]. Pt will continue to benefit from  skilled PT to address continued deficits.   Eval: Patient is a 18 y.o. female who was seen today for physical therapy treatment s/p L inferior labral repair and remplissage. Pt has expected ROM deficits, strength deficits, and pain after recent surgery. Pt is functionally limited with ADL, self care, and exercise due to recent surgical intervention as expected. Pain is well managed at this time. Pt tolerated exercise review and PROM without difficulty. Plan to continue to progress per protocol. Pt would benefit from continued skilled therapy in order to reach goals and maximize functional L UE strength and ROM for full return to PLOF.    OBJECTIVE IMPAIRMENTS decreased activity tolerance, decreased ROM, decreased strength, increased muscle spasms, impaired UE functional use, improper body mechanics, postural dysfunction, and pain.    ACTIVITY LIMITATIONS cleaning, lifting, bending, carry, dressing, bathing, feeding, community activity, meal prep, laundry, and yard work.    PERSONAL FACTORS Time since onset of injury/illness/exacerbation is also affecting patient's functional outcome.     REHAB POTENTIAL: Good   CLINICAL DECISION MAKING: Stable/uncomplicated   EVALUATION COMPLEXITY: Low     GOALS:     SHORT TERM GOALS: Target date: 07/14/2024       Pt will become independent with HEP in order to demonstrate synthesis of PT education.   Goal status: met   2.  Pt will be able to demonstrate full PROM  in order to demonstrate functional improvement in UE for progression to next phase of protocol.     Goal status: met   3.  Pt will report at least 2 pt reduction on NPRS scale for pain in order to demonstrate functional improvement with household activity, self care, and ADL.    Goal status: met       LONG TERM GOALS: Target date: 08/25/2024       Pt  will become independent with final HEP in order to demonstrate synthesis of PT education.   Goal status:  INITIAL   2.  Pt will  be able to reach [facility] and carry/hold >25lbs in order to demonstrate functional improvement in L UE strength for return to PLOF and exercise.    Goal status: INITIAL   3.  Pt will be able to demonstrate full [place] AROM of the L shoulder in order to demonstrate functional improvement in UE function for self-care and house hold duties.    Goal status: INITIAL   4.  Pt will test to within 80% HHD of R UE in order to progress to plyo loading of shoulder.     Goal status: INITIAL      PLAN: PT FREQUENCY: 1-2x/week   PT DURATION: 12 weeks   PLANNED INTERVENTIONS: Therapeutic exercises, Therapeutic activity, Neuromuscular re-education, Patient/Family education, Joint mobilization, Dry Needling, Spinal mobilization, Cryotherapy, Moist heat, Taping, and Manual therapy, Re-evaluation   PLAN FOR NEXT SESSION:  PROM,  avoid posterior shoulder stretching/crossbody adduction         Rojean JONELLE Batten, PT 07/08/2024, 11:37 AM   Dale Call PT, DPT 07/12/24 12:48 PM

## 2024-07-12 ENCOUNTER — Ambulatory Visit: Attending: Orthopaedic Surgery

## 2024-07-12 DIAGNOSIS — M25512 Pain in left shoulder: Secondary | ICD-10-CM | POA: Insufficient documentation

## 2024-07-12 DIAGNOSIS — M6281 Muscle weakness (generalized): Secondary | ICD-10-CM | POA: Diagnosis present

## 2024-07-12 NOTE — Therapy (Unsigned)
 OUTPATIENT PHYSICAL THERAPY UPPER EXTREMITY Treatment/PROGRESS NOTE Dates of Reporting Period: 06/02/24 - 07/13/24       Patient Name: Marissa [name] MRN: [id number] DOB:2006-04-02, 18 y.o., female Today's Date: 07/13/2024  END OF SESSION:  PT End of Session - 07/12/24 1543     Visit Number 9    Number of Visits 13    Date for PT Re-Evaluation 08/31/24    Authorization Type [place] Medicaid    Authorization Time Period 06/02/2024-08/31/2024    Authorization - Number of Visits 13    PT Start Time 1540    PT Stop Time 1615    PT Time Calculation (min) 35 min    Activity Tolerance Patient tolerated treatment well    Behavior During Therapy [facility] for tasks assessed/performed                Past Medical History:  Diagnosis Date   ADHD (attention deficit hyperactivity disorder)    Allergy    Phreesia 05/08/2021   Closed fracture of base of proximal phalanx of right thumb 06/04/2015   Salter-Harris type II Ortho: Donnice Robinsons [facility] [place])    Nocturnal enuresis 03/13/2014   Shoulder subluxation, left, initial encounter 01/12/2022   Urinary urgency 08/14/2015   Past Surgical History:  Procedure Laterality Date   SHOULDER ARTHROSCOPY WITH LABRAL REPAIR Left 05/30/2024   SHOULDER ARTHROSCOPY WITH LABRAL REPAIR Left 05/30/2024   Procedure: ARTHROSCOPY, SHOULDER, WITH GLENOID LABRUM REPAIR;  Surgeon: Genelle Standing, MD;  Location: [facility];  Service: Orthopedics;  Laterality: Left;  LEFT SHOULDER ARTHROSCOPY WITH LABRAL REPAIR AND REMPLISSAGE   WISDOM TOOTH EXTRACTION     Patient Active Problem List   Diagnosis Date Noted   Shoulder instability, left 05/30/2024   Incontinence of urine 06/21/2023   Panic attacks 08/17/2022   Acne 05/10/2021   Body odor 05/10/2021   Osgood-Schlatter's disease of knees, bilateral 09/07/2016   History of snoring 08/27/2015   PTSD (post-traumatic stress disorder) 11/28/2013   Allergic rhinitis 09/20/2013    Eczema 09/20/2013   Adopted 09/20/2013    PCP: Leta Crazier, MD   REFERRING PROVIDER:  Genelle Standing, MD     REFERRING DIAG:  [id number] (ICD-10-CM) - Shoulder instability, left      THERAPY DIAG:  Acute pain of left shoulder  Muscle weakness (generalized)  Rationale for Evaluation and Treatment: Rehabilitation  ONSET DATE: 05/30/24 DOS  Days since surgery: 43  PROCEDURE: 1.  Left shoulder inferior labral repair 2.  Left shoulder arthroscopic remplissage  SUBJECTIVE:  SUBJECTIVE STATEMENT:    Eval: Pt has had very little pain post op. Pt is not able to feel the L shoulder area, denies NT or s/s infection and DVT. Pt has been using her sling but detached the straps last night and is unsure of how to put it back on. Pt has not changed surgical bandages. Pt has been icing with her machine. Pt is taking meds at directed. Has not had a bowel movement in last 3 days. Slept in chair for first 2 days and is now sleeping in bed.    PERTINENT HISTORY: PTSD  PAIN:  Are you having pain? 0/10: none right now at rest; has had pain into L shoulder surgical sites  Pain description: surgical pain and spasm Aggravating factors: movement, at baseline Relieving factors: rest, meds ice  PRECAUTIONS: Shoulder  RED FLAGS: None   WEIGHT BEARING RESTRICTIONS: Yes WNB L UE  FALLS:  Has patient fallen in last 6 months? No  LIVING ENVIRONMENT: Lives with: lives with their family Lives in: House/apartment Has following equipment at home: None  OCCUPATION: Consulting civil engineer- art major, plays rec sports, video gaming, reading, drawing for leisure  PLOF: Independent  PATIENT GOALS: return to normalized activity    OBJECTIVE:  Note: Objective measures were completed at Evaluation unless otherwise  noted.  DIAGNOSTIC FINDINGS:    IMPRESSION: 1. 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 No detached labral tear. 2. Partial-thickness chondral surface irregularity of the anteroinferior glenoid. 3. Small partial thickness interstitial tear of the distal supraspinatus tendon. No full-thickness or retracted rotator cuff tear. 4. Mild subacromial-subdeltoid bursitis.     Electronically Signed   By: Mabel Converse D.O.   On: 03/13/2024 18:24    PATIENT SURVEYS : Upper Extremity Function Scale: 65 / 80 = 81.3 %    COGNITION:           Overall cognitive status: Within functional limits for tasks assessed                                  SENSATION: WFL    UPPER EXTREMITY PROM:    Flexion to 30, ABD 30, IR  belly, ER 0  UPPER EXTREMITY AROM (07/12/24): Shoulder AROM R/L:   Shoulder Flexion: 180/ 155 Shoulder Abduction: 180/ 155 Shoulder Extension: 75/ 45 Shoulder ER: 50/ 70 Shoulder IR: 80/ 90     UPPER EXTREMITY MMT: unable to test due to surgical precautions    PALPATION:  Numbness around incision sites;   Incision sites: clean, dry, no erythema or drainage noted; surgical bandages in place   TODAY'S TREATMENT:   Physical Performance:  Shoulder AROM R/L:   Shoulder Flexion: 180/ 155 Shoulder Abduction: 180/ 155 Shoulder Extension: 75/ 45 Shoulder ER: 50/ 70 Shoulder IR: 80/ 90   There.Ex: R side lying, L shoulder ER: 3x12   Seated scap retraction with GTB: 3x12        7/7 PROM L shoulder Scap squeezes 5 x20 Supine rhythmic stabilization at 90deg with serratus punches  Supine wand flexion 2x10 Supine active flexion 2x10 Seated ER 2x10 Sidelying abduction 2x10 Seated press serratus punches 2x10 Standing active flexion in front of mirror 2x5 Standing abduction in front of mirror 2x5 Bent over row with cues for scap engagement 2x15 Ball taps with  weighted ball on wall at 80 degrees ER.  Prone T, Y 2x10  7/3 MANUAL STM upper trap, levator; cervical sidebend + lateral joint mobs Sidelying ER, reach/pull to 90 deg flexion, horiz abduction, abduction- to 90 & full range Prone row, triceps, ext +superman  6/30 MANUAL STM upper trap, levator, scalenes, subscap     Pin and stretch upper trap Supine AA & AROM flexion Rythmic stabs at 90 flx Upper trap stretch with sheet for first rib depression AROM in mirror full range Prone retraction, +ext, +flexion   6/27  PROM to protocol limits- able to go further but held at this time  AAROM flexion 2x10 AAROM ABD to 45 2x10 Isometrics IR/ER 2x10 at wall 3s  Isometric ABD 2x10 Isometric ext 2x10  6/18 -PROM L shoulder -Supine wand press up 2x10 -Supine wand flexion 2x10 (to 90deg) -Scap squeezes 3 2x10 -Isometric flexion and abduction 5 x20ea      PATIENT EDUCATION: Education details: signs of infection, cryotherapy, edema management, joint protection, diagnosis, prognosis, anatomy, exercise progression, DOMS expectations, muscle firing,  envelope of function, HEP, POC Person educated: Patient Education method: Explanation, Demonstration, Tactile cues, Verbal cues, and Handouts Education comprehension: verbalized understanding, returned demonstration, verbal cues required, tactile cues required, and needs further education     HOME EXERCISE PROGRAM:  Access Code: [email or web address] URL: [email or web address] Date: 06/02/2024 Prepared by: Dale Call      ASSESSMENT:   CLINICAL IMPRESSION:  Pt is arriving to [facility] to establish care as this clinic is closer to home. Progress note performed as pt is close to 10 visit but also being new to clinic. Pt is 6 weeks post op thus assessed AROM today. PT with excellent, available range in all planes. Limited ER due to protocol (45 deg allowed) as pt has 50 degrees today. Encouraged pt to bring in HEP form other clinic so  current PT's here can make adjustments as needed and appropriate per protocol. Encouraged continuing AROM in flexion and abduction using mirror feedback and progressed scap retractions with GTB today providing education on how to mount safely for use. Will plan to update HEP next visit for strengthening of periscapulars. Also discussed PT frequency to 1x/week due to insurance barriers approving minimal visits. Pt with good understanding of POC and agreeable remaining highly motivated to progress. Pt will continue to benefit from skilled PT to address continued deficits.    Eval: Patient is a 18 y.o. female who was seen today for physical therapy treatment s/p L inferior labral repair and remplissage. Pt has expected ROM deficits, strength deficits, and pain after recent surgery. Pt is functionally limited with ADL, self care, and exercise due to recent surgical intervention as expected. Pain is well managed at this time. Pt tolerated exercise review and PROM without difficulty. Plan to continue to progress per protocol. Pt would benefit from continued skilled therapy in order to reach goals and maximize functional L UE strength and ROM for full return to PLOF.    OBJECTIVE IMPAIRMENTS decreased activity tolerance, decreased ROM, decreased strength, increased muscle spasms, impaired UE functional use, improper body mechanics, postural dysfunction, and pain.    ACTIVITY LIMITATIONS cleaning, lifting, bending, carry, dressing, bathing, feeding, community activity, meal prep, laundry, and yard work.    PERSONAL FACTORS Time since onset of injury/illness/exacerbation is also affecting patient's functional outcome.     REHAB POTENTIAL: Good   CLINICAL DECISION MAKING: Stable/uncomplicated   EVALUATION COMPLEXITY: Low     GOALS:     SHORT TERM GOALS: Target date: 07/14/2024       Pt will  become independent with HEP in order to demonstrate synthesis of PT education.   Goal status: met   2.  Pt  will be able to demonstrate full PROM  in order to demonstrate functional improvement in UE for progression to next phase of protocol.     Goal status: met   3.  Pt will report at least 2 pt reduction on NPRS scale for pain in order to demonstrate functional improvement with household activity, self care, and ADL.    Goal status: met       LONG TERM GOALS: Target date: 08/25/2024       Pt  will become independent with final HEP in order to demonstrate synthesis of PT education.   Goal status: INITIAL   2.  Pt will be able to reach [facility] and carry/hold >25lbs in order to demonstrate functional improvement in L UE strength for return to PLOF and exercise.    Goal status: INITIAL   3.  Pt will be able to demonstrate full OH AROM of the L shoulder in order to demonstrate functional improvement in UE function for self-care and house hold duties.    Goal status: PROGRESSING  7/16: Making excellent progress in overhead AROM. See above.   4.  Pt will test to within 80% HHD of R UE in order to progress to plyo loading of shoulder.     Goal status: INITIAL      PLAN: PT FREQUENCY: 1-2x/week   PT DURATION: 12 weeks   PLANNED INTERVENTIONS: Therapeutic exercises, Therapeutic activity, Neuromuscular re-education, Patient/Family education, Joint mobilization, Dry Needling, Spinal mobilization, Cryotherapy, Moist heat, Taping, and Manual therapy, Re-evaluation   PLAN FOR NEXT SESSION: AROM in all planes per protocol tolerance. Progress strengthening per protocol.          Dorina HERO. Fairly IV, PT, DPT Physical Therapist- [facility]  [facility] 07/13/24 8:04 AM

## 2024-07-18 ENCOUNTER — Ambulatory Visit

## 2024-07-20 ENCOUNTER — Ambulatory Visit

## 2024-07-20 DIAGNOSIS — M6281 Muscle weakness (generalized): Secondary | ICD-10-CM

## 2024-07-20 DIAGNOSIS — M25512 Pain in left shoulder: Secondary | ICD-10-CM | POA: Diagnosis not present

## 2024-07-20 NOTE — Therapy (Signed)
 OUTPATIENT PHYSICAL THERAPY UPPER EXTREMITY Treatment/PROGRESS NOTE Dates of Reporting Period: 07/13/24 - 07/20/24      Patient Name: Marissa [name] MRN: [id number] DOB:March 09, 2006, 18 y.o., female Today's Date: 07/20/2024  END OF SESSION:  PT End of Session - 07/20/24 1614     Visit Number 10    Number of Visits 13    Date for PT Re-Evaluation 08/31/24    Authorization Type [place] Medicaid    Authorization Time Period 06/02/2024-08/31/2024    Authorization - Number of Visits 13    PT Start Time 1415    PT Stop Time 1455    PT Time Calculation (min) 40 min    Activity Tolerance Patient tolerated treatment well    Behavior During Therapy [facility] for tasks assessed/performed                 Past Medical History:  Diagnosis Date   ADHD (attention deficit hyperactivity disorder)    Allergy    Phreesia 05/08/2021   Closed fracture of base of proximal phalanx of right thumb 06/04/2015   Salter-Harris type II Ortho: Donnice Robinsons [facility] [place])    Nocturnal enuresis 03/13/2014   Shoulder subluxation, left, initial encounter 01/12/2022   Urinary urgency 08/14/2015   Past Surgical History:  Procedure Laterality Date   SHOULDER ARTHROSCOPY WITH LABRAL REPAIR Left 05/30/2024   SHOULDER ARTHROSCOPY WITH LABRAL REPAIR Left 05/30/2024   Procedure: ARTHROSCOPY, SHOULDER, WITH GLENOID LABRUM REPAIR;  Surgeon: Genelle Standing, MD;  Location:  [facility];  Service: Orthopedics;  Laterality: Left;  LEFT SHOULDER ARTHROSCOPY WITH LABRAL REPAIR AND REMPLISSAGE   WISDOM TOOTH EXTRACTION     Patient Active Problem List   Diagnosis Date Noted   Shoulder instability, left 05/30/2024   Incontinence of urine 06/21/2023   Panic attacks 08/17/2022   Acne 05/10/2021   Body odor 05/10/2021   Osgood-Schlatter's disease of knees, bilateral 09/07/2016   History of snoring 08/27/2015   PTSD (post-traumatic stress disorder) 11/28/2013   Allergic rhinitis 09/20/2013    Eczema 09/20/2013   Adopted 09/20/2013    PCP: Leta Crazier, MD   REFERRING PROVIDER:  Genelle Standing, MD     REFERRING DIAG:  [phone] (ICD-10-CM) - Shoulder instability, left      THERAPY DIAG:  Acute pain of left shoulder  Muscle weakness (generalized)  Rationale for Evaluation and Treatment: Rehabilitation  ONSET DATE: 05/30/24 DOS  Days since surgery: 51  PROCEDURE: 1.  Left shoulder inferior labral repair 2.  Left shoulder arthroscopic remplissage  SUBJECTIVE:  SUBJECTIVE STATEMENT:  Pt reports that she felt good after previous session. Pt states that she has no pain in L shoulder today, just some soreness. Pt reports compliance with HEP and states that she likes exercises that were added last week.   Eval: Pt has had very little pain post op. Pt is not able to feel the L shoulder area, denies NT or s/s infection and DVT. Pt has been using her sling but detached the straps last night and is unsure of how to put it back on. Pt has not changed surgical bandages. Pt has been icing with her machine. Pt is taking meds at directed. Has not had a bowel movement in last 3 days. Slept in chair for first 2 days and is now sleeping in bed.    PERTINENT HISTORY: PTSD  PAIN:  Are you having pain? 0/10: none right now at rest; has had pain into L shoulder surgical sites  Pain description: surgical pain and spasm Aggravating factors: movement, at baseline Relieving factors: rest, meds ice  PRECAUTIONS: Shoulder  RED FLAGS: None   WEIGHT BEARING RESTRICTIONS: Yes WNB L UE  FALLS:  Has patient fallen in last 6 months? No  LIVING ENVIRONMENT: Lives with: lives with their family Lives in: House/apartment Has following equipment at home: None  OCCUPATION: Consulting civil engineer- art major, plays rec  sports, video gaming, reading, drawing for leisure  PLOF: Independent  PATIENT GOALS: return to normalized activity    OBJECTIVE:  Note: Objective measures were completed at Evaluation unless otherwise noted.  DIAGNOSTIC FINDINGS:    IMPRESSION: 1. 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 No detached labral tear. 2. Partial-thickness chondral surface irregularity of the anteroinferior glenoid. 3. Small partial thickness interstitial tear of the distal supraspinatus tendon. No full-thickness or retracted rotator cuff tear. 4. Mild subacromial-subdeltoid bursitis.     Electronically Signed   By: Mabel Converse D.O.   On: 03/13/2024 18:24    PATIENT SURVEYS : Upper Extremity Function Scale: 65 / 80 = 81.3 %    COGNITION:           Overall cognitive status: Within functional limits for tasks assessed                                  SENSATION: WFL    UPPER EXTREMITY PROM:    Flexion to 30, ABD 30, IR  belly, ER 0  UPPER EXTREMITY AROM (07/12/24): Shoulder AROM R/L:   Shoulder Flexion: 180/ 155 Shoulder Abduction: 180/ 155 Shoulder Extension: 75/ 45 Shoulder ER: 50/ 70 Shoulder IR: 80/ 90     UPPER EXTREMITY MMT: unable to test due to surgical precautions    PALPATION:  Numbness around incision sites;   Incision sites: clean, dry, no erythema or drainage noted; surgical bandages in place   TODAY'S TREATMENT: 07/20/24  Shoulder AROM L:   Shoulder Flexion: 155 Shoulder Abduction: 155 Shoulder Extension: 60 Shoulder ER: 70 Shoulder IR: 90   Ther.Ex: Sidelying L shoulder AROM ER with towel under elbow 2x15- SPT held pt's elbow against towel to isolate motion Supine L shoulder isometrics with 5 second hold x multiple reps of flexion/extension and abduction/adduction; 2 rounds GTB rows 2x15 - pt given verbal and tactile cues to squeeze shoulder blades  together GTB pull-downs 2x15 3# DB shoulder shrugs 2x15  Manual: Scar tissue massage x multiple minutes  to all 3 scars on pt's L shoulder       PATIENT EDUCATION: Education details: signs of infection, cryotherapy, edema management, joint protection, diagnosis, prognosis, anatomy, exercise progression, DOMS expectations, muscle firing,  envelope of function, HEP, POC Person educated: Patient Education method: Explanation, Demonstration, Tactile cues, Verbal cues, and Handouts Education comprehension: verbalized understanding, returned demonstration, verbal cues required, tactile cues required, and needs further education     HOME EXERCISE PROGRAM:  Access Code: [email or web address] URL: [email or web address] Date: 06/02/2024 Prepared by: Dale Call      ASSESSMENT:   CLINICAL IMPRESSION: Pt is currently 7 weeks 2 days post-op. Pt arrived with high motivation to participate in PT activities. Pt tolerated all periscapular endurance training well with no increases in L shoulder pain throughout. Pt's ROM remained the same from previous session, but pt is doing excellent in terms of recovery timeline. Patient's condition has the potential to improve in response to therapy. Maximum improvement is yet to be obtained. The anticipated improvement is attainable and reasonable in a generally predictable time. Pt will continue to benefit from skilled PT to address continued deficits.    Eval: Patient is a 18 y.o. female who was seen today for physical therapy treatment s/p L inferior labral repair and remplissage. Pt has expected ROM deficits, strength deficits, and pain after recent surgery. Pt is functionally limited with ADL, self care, and exercise due to recent surgical intervention as expected. Pain is well managed at this time. Pt tolerated exercise review and PROM without difficulty. Plan to continue to progress per protocol. Pt would benefit from continued skilled therapy in order to  reach goals and maximize functional L UE strength and ROM for full return to PLOF.    OBJECTIVE IMPAIRMENTS decreased activity tolerance, decreased ROM, decreased strength, increased muscle spasms, impaired UE functional use, improper body mechanics, postural dysfunction, and pain.    ACTIVITY LIMITATIONS cleaning, lifting, bending, carry, dressing, bathing, feeding, community activity, meal prep, laundry, and yard work.    PERSONAL FACTORS Time since onset of injury/illness/exacerbation is also affecting patient's functional outcome.     REHAB POTENTIAL: Good   CLINICAL DECISION MAKING: Stable/uncomplicated   EVALUATION COMPLEXITY: Low     GOALS:     SHORT TERM GOALS: Target date: 07/14/2024       Pt will become independent with HEP in order to demonstrate synthesis of PT education.   Goal status: met   2.  Pt will be able to demonstrate full PROM  in order to demonstrate functional improvement in UE for progression to next phase of protocol.     Goal status: met   3.  Pt will report at least 2 pt reduction on NPRS scale for pain in order to demonstrate functional improvement with household activity, self care, and ADL.    Goal status: met       LONG TERM GOALS: Target date: 08/25/2024       Pt  will become independent with final HEP in order to demonstrate synthesis of PT education.   Goal status: INITIAL   2.  Pt will be able to reach [facility] and carry/hold >25lbs in order to demonstrate functional improvement in L UE strength for return to PLOF and exercise.    Goal status: INITIAL   3.  Pt will be able to demonstrate full OH AROM of the L shoulder in order to demonstrate functional improvement in UE function for self-care and house hold duties.    Goal status: PROGRESSING  7/16:  Making excellent progress in overhead AROM. See above.   4.  Pt will test to within 80% HHD of R UE in order to progress to plyo loading of shoulder.     Goal status: INITIAL       PLAN: PT FREQUENCY: 1-2x/week   PT DURATION: 12 weeks   PLANNED INTERVENTIONS: Therapeutic exercises, Therapeutic activity, Neuromuscular re-education, Patient/Family education, Joint mobilization, Dry Needling, Spinal mobilization, Cryotherapy, Moist heat, Taping, and Manual therapy, Re-evaluation   PLAN FOR NEXT SESSION: AROM in all planes per protocol tolerance. Progress strengthening per protocol.          Yoltzin Ransom, SPT  This entire session was performed under direct supervision and direction of a licensed therapist/therapist assistant . I have personally read, edited and approve of the note as written.  Marina  Leopoldo, PT, DPT Physical Therapist - [facility]  Outpatient Physical Therapy- Main Campus [phone]     07/20/24 5:05 PM

## 2024-07-24 ENCOUNTER — Encounter

## 2024-07-26 ENCOUNTER — Ambulatory Visit

## 2024-07-26 DIAGNOSIS — M6281 Muscle weakness (generalized): Secondary | ICD-10-CM

## 2024-07-26 DIAGNOSIS — M25512 Pain in left shoulder: Secondary | ICD-10-CM

## 2024-07-26 NOTE — Therapy (Signed)
 OUTPATIENT PHYSICAL THERAPY TREATMENT Patient Name: Marissa [name] MRN: [id number] DOB:2005-12-29, 18 y.o., female Today's Date: 07/26/2024  END OF SESSION:  PT End of Session - 07/26/24 1549     Visit Number 11    Number of Visits 27    Date for PT Re-Evaluation 08/31/24    Authorization Type [place] Medicaid    Authorization Time Period 07/19/24-10/10/24 for 24 visits    Authorization - Visit Number 1    Authorization - Number of Visits 24    Progress Note Due on Visit 20    PT Start Time 1530    PT Stop Time 1610    PT Time Calculation (min) 40 min    Activity Tolerance Patient tolerated treatment well;No increased pain    Behavior During Therapy [facility] for tasks assessed/performed          Past Medical History:  Diagnosis Date   ADHD (attention deficit hyperactivity disorder)    Allergy    Phreesia 05/08/2021   Closed fracture of base of proximal phalanx of right thumb 06/04/2015   Salter-Harris type II Ortho: Donnice Robinsons [facility] [place])    Nocturnal enuresis 03/13/2014   Shoulder subluxation, left, initial encounter 01/12/2022   Urinary urgency 08/14/2015   Past Surgical History:  Procedure Laterality Date   SHOULDER ARTHROSCOPY WITH LABRAL REPAIR Left 05/30/2024   SHOULDER ARTHROSCOPY WITH LABRAL REPAIR Left 05/30/2024   Procedure: ARTHROSCOPY, SHOULDER, WITH GLENOID LABRUM REPAIR;  Surgeon: Genelle Standing, MD;  Location: [facility];  Service: Orthopedics;  Laterality: Left;  LEFT SHOULDER ARTHROSCOPY WITH LABRAL REPAIR AND REMPLISSAGE   WISDOM TOOTH EXTRACTION     Patient Active Problem List   Diagnosis Date Noted   Shoulder instability, left 05/30/2024   Incontinence of urine 06/21/2023   Panic attacks 08/17/2022   Acne 05/10/2021   Body odor 05/10/2021   Osgood-Schlatter's disease of knees, bilateral 09/07/2016   History of snoring 08/27/2015   PTSD (post-traumatic stress disorder) 11/28/2013   Allergic rhinitis 09/20/2013    Eczema 09/20/2013   Adopted 09/20/2013   PCP: Leta Crazier, MD REFERRING PROVIDER:  Genelle Standing, MD  REFERRING DIAG:  [phone] (ICD-10-CM) - Shoulder instability, left    THERAPY DIAG:  Acute pain of left shoulder  Muscle weakness (generalized)  Rationale for Evaluation and Treatment: Rehabilitation  ONSET DATE: 05/30/24 DOS  Days since surgery: 57  PROCEDURE: 1.  Left shoulder inferior labral repair 2.  Left shoulder arthroscopic remplissage  SUBJECTIVE:  SUBJECTIVE STATEMENT: Pt reports that she felt good after previous session. Pt states that she has no pain in L shoulder today, just some soreness. Pt reports compliance with HEP.  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 Pt has had very little pain post op. Pt is not able to feel the L shoulder area, denies NT or s/s infection and DVT. Pt has been using her sling but detached the straps last night and is unsure of how to put it back on. Pt has not changed surgical bandages. Pt has been icing with her machine. Pt is taking meds at directed. Has not had a bowel movement in last 3 days. Slept in chair for first 2 days and is now sleeping in bed. PTSD  PAIN:  Are you having pain? 0/10: none right now at rest; has had pain into L shoulder surgical sites  PRECAUTIONS: See protocol   WEIGHT BEARING RESTRICTIONS: See protocol   FALLS:  Has patient fallen in last 6 months? No  LIVING ENVIRONMENT: Lives with: lives with their family Lives in: House/apartment Has following equipment at home: None  OCCUPATION: Consulting civil engineer- art major, plays rec sports, video gaming, reading, drawing for leisure  PLOF: Independent  PATIENT GOALS: return to normalized activity   OBJECTIVE:    TODAY'S  TREATMENT: 07/26/24                -flexion table slides x20, abduction physio ball roll outs x20                 -overhead scaption x15 -seated LUE ER x15 in 45 degrees horizontal ABDCT, 70 degree scaption  --overhead scaption x15 c weight bar  -seated LUE ER x15 in 45 degrees horizontal ABDCT, 70 degree scaption c 1lb FW -overhead scaption x15 c weight bar -seated LUE ER x15 in 90 degrees horizontal ABDCT, 70 degrees ABDCT c 2lb FW -YTB horiztonal ABDCT 0-100 degrees bilat c scapular squeeze -LUE basket ball circles 15x CW, 15xCCW: in flexion, in abdct, in neutral  *discussed prospect of transfering PT services back to [place] in 3 weeks upon return to [employer]     PATIENT EDUCATION: Education details: signs of infection, cryotherapy, edema management, joint protection, diagnosis, prognosis, anatomy, exercise progression, DOMS expectations, muscle firing,  envelope of function, HEP, POC Person educated: Patient Education method: Explanation, Demonstration, Tactile cues, Verbal cues, and Handouts Education comprehension: verbalized understanding, returned demonstration, verbal cues required, tactile cues required, and needs further education     HOME EXERCISE PROGRAM:  Access Code: [email or web address] URL: [email or web address] Date: 06/02/2024 Prepared by: Dale Call      ASSESSMENT:   CLINICAL IMPRESSION: Pt is currently 8 weeks 1 days post-op. ROM and pain are both ahead of what is typical for this surgery. Progressively advanced loading which is well tolerated, but conservative for prudence. Began stabilization training in closed chain.  Pt will continue to benefit from skilled PT to address continued deficits.    OBJECTIVE IMPAIRMENTS decreased activity tolerance, decreased ROM, decreased strength, increased muscle spasms, impaired UE functional use, improper body mechanics, postural dysfunction, and pain.    ACTIVITY LIMITATIONS cleaning, lifting, bending, carry,  dressing, bathing, feeding, community activity, meal prep, laundry, and yard work.    PERSONAL FACTORS Time since onset of injury/illness/exacerbation is also affecting patient's functional outcome.     REHAB POTENTIAL: Good  CLINICAL DECISION MAKING: Stable/uncomplicated  EVALUATION COMPLEXITY: Low    GOALS:    SHORT TERM  GOALS: Target date: 07/14/2024   1. Pt will become independent with HEP in order to demonstrate synthesis of PT education.  Goal status: met   2.  Pt will be able to demonstrate full PROM  in order to demonstrate functional improvement in UE for progression to next phase of protocol.  Goal status: met   3.  Pt will report at least 2 pt reduction on NPRS scale for pain in order to demonstrate functional improvement with household activity, self care, and ADL.   Goal status: met     LONG TERM GOALS: Target date: 08/25/2024    Pt  will become independent with final HEP in order to demonstrate synthesis of PT education.  Goal status: INITIAL   2.  Pt will be able to reach [facility] and carry/hold >25lbs in order to demonstrate functional improvement in L UE strength for return to PLOF and exercise.   Goal status: INITIAL   3.  Pt will be able to demonstrate full OH AROM of the L shoulder in order to demonstrate functional improvement in UE function for self-care and house hold duties.   Goal status: PROGRESSING  7/16: Making excellent progress in overhead AROM. See above.   4.  Pt will test to within 80% HHD of R UE in order to progress to plyo loading of shoulder.    Goal status: INITIAL     PLAN: PT FREQUENCY: 1-2x/week  PT DURATION: 12 weeks  PLANNED INTERVENTIONS: Therapeutic exercises, Therapeutic activity, Neuromuscular re-education, Patient/Family education, Joint mobilization, Dry Needling, Spinal mobilization, Cryotherapy, Moist heat, Taping, and Manual therapy, Re-evaluation  PLAN FOR NEXT SESSION: AROM in all planes per protocol tolerance. Progress  strengthening per protocol.          3:58 PM, 07/26/24 Peggye JAYSON Linear, PT, DPT Physical Therapist - [facility]  Outpatient Physical Therapy- Main Campus [phone]

## 2024-08-01 ENCOUNTER — Ambulatory Visit: Attending: Pediatrics | Admitting: Physical Therapy

## 2024-08-01 ENCOUNTER — Telehealth: Payer: Self-pay | Admitting: Physical Therapy

## 2024-08-01 NOTE — Therapy (Deleted)
 OUTPATIENT PHYSICAL THERAPY TREATMENT Patient Name: Marissa [name] MRN: [id number] DOB:Jul 23, 2006, 18 y.o., female Today's Date: 08/01/2024  END OF SESSION:    Past Medical History:  Diagnosis Date   ADHD (attention deficit hyperactivity disorder)    Allergy    Phreesia 05/08/2021   Closed fracture of base of proximal phalanx of right thumb 06/04/2015   Salter-Harris type II Ortho: Donnice Robinsons [facility] of [place])    Nocturnal enuresis 03/13/2014   Shoulder subluxation, left, initial encounter 01/12/2022   Urinary urgency 08/14/2015   Past Surgical History:  Procedure Laterality Date   SHOULDER ARTHROSCOPY WITH LABRAL REPAIR Left 05/30/2024   SHOULDER ARTHROSCOPY WITH LABRAL REPAIR Left 05/30/2024   Procedure: ARTHROSCOPY, SHOULDER, WITH GLENOID LABRUM REPAIR;  Surgeon: Genelle Standing, MD;  Location: [facility];  Service: Orthopedics;  Laterality: Left;  LEFT SHOULDER ARTHROSCOPY WITH LABRAL REPAIR AND REMPLISSAGE   WISDOM TOOTH EXTRACTION     Patient Active Problem List   Diagnosis Date Noted   Shoulder instability, left 05/30/2024   Incontinence of urine 06/21/2023   Panic attacks 08/17/2022   Acne 05/10/2021   Body odor 05/10/2021   Osgood-Schlatter's disease of knees, bilateral 09/07/2016   History of snoring 08/27/2015   PTSD (post-traumatic stress disorder) 11/28/2013   Allergic rhinitis 09/20/2013   Eczema 09/20/2013   Adopted 09/20/2013   PCP: Leta Crazier, MD REFERRING PROVIDER:  Genelle Standing, MD  REFERRING DIAG:  M25.312 (ICD-10-CM) - Shoulder instability, left    THERAPY DIAG:  No diagnosis found.  Rationale for Evaluation and Treatment: Rehabilitation  ONSET DATE: 05/30/24 DOS  Days since surgery: 63  PROCEDURE: 1.  Left shoulder inferior labral repair 2.  Left shoulder arthroscopic remplissage  SUBJECTIVE:                                                                                                                                                                                       SUBJECTIVE STATEMENT: Pt reports that she felt good after previous session. Pt states that she has no pain in L shoulder today, just some soreness. Pt reports compliance with HEP.  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 Pt has had very little pain post op. Pt is not able to feel the L shoulder area, denies NT or s/s infection and DVT. Pt has been using her sling but detached the straps last night and is unsure of how to put it back on. Pt has not changed surgical bandages. Pt has been icing with her machine. Pt is taking meds at directed. Has not had a bowel movement in last 3 days. Slept  in chair for first 2 days and is now sleeping in bed. PTSD  PAIN:  Are you having pain? 0/10: none right now at rest; has had pain into L shoulder surgical sites  PRECAUTIONS: See protocol   WEIGHT BEARING RESTRICTIONS: See protocol   FALLS:  Has patient fallen in last 6 months? No  LIVING ENVIRONMENT: Lives with: lives with their family Lives in: House/apartment Has following equipment at home: None  OCCUPATION: Consulting civil engineer- art major, plays rec sports, video gaming, reading, drawing for leisure  PLOF: Independent  PATIENT GOALS: return to normalized activity   OBJECTIVE:    TODAY'S TREATMENT: 08/01/24                -flexion table slides x20, abduction physio ball roll outs x20                 -overhead scaption x15 -seated LUE ER x15 in 45 degrees horizontal ABDCT, 70 degree scaption  --overhead scaption x15 c weight bar  -seated LUE ER x15 in 45 degrees horizontal ABDCT, 70 degree scaption c 1lb FW -overhead scaption x15 c weight bar -seated LUE ER x15 in 90 degrees horizontal ABDCT, 70 degrees ABDCT c 2lb FW -YTB horiztonal ABDCT 0-100 degrees bilat c scapular squeeze -LUE basket ball circles 15x CW, 15xCCW: in flexion, in abdct, in neutral  *discussed  prospect of transfering PT services back to [place] in 3 weeks upon return to [employer]     PATIENT EDUCATION: Education details: signs of infection, cryotherapy, edema management, joint protection, diagnosis, prognosis, anatomy, exercise progression, DOMS expectations, muscle firing,  envelope of function, HEP, POC Person educated: Patient Education method: Explanation, Demonstration, Tactile cues, Verbal cues, and Handouts Education comprehension: verbalized understanding, returned demonstration, verbal cues required, tactile cues required, and needs further education     HOME EXERCISE PROGRAM:  Access Code: [email or web address] URL: [email or web address] Date: 06/02/2024 Prepared by: Dale Call      ASSESSMENT:   CLINICAL IMPRESSION: Pt is currently 8 weeks 1 days post-op. ROM and pain are both ahead of what is typical for this surgery. Progressively advanced loading which is well tolerated, but conservative for prudence. Began stabilization training in closed chain.  Pt will continue to benefit from skilled PT to address continued deficits.    OBJECTIVE IMPAIRMENTS decreased activity tolerance, decreased ROM, decreased strength, increased muscle spasms, impaired UE functional use, improper body mechanics, postural dysfunction, and pain.    ACTIVITY LIMITATIONS cleaning, lifting, bending, carry, dressing, bathing, feeding, community activity, meal prep, laundry, and yard work.    PERSONAL FACTORS Time since onset of injury/illness/exacerbation is also affecting patient's functional outcome.     REHAB POTENTIAL: Good  CLINICAL DECISION MAKING: Stable/uncomplicated  EVALUATION COMPLEXITY: Low    GOALS:    SHORT TERM GOALS: Target date: 07/14/2024   1. Pt will become independent with HEP in order to demonstrate synthesis of PT education.  Goal status: met   2.  Pt will be able to demonstrate full PROM  in order to demonstrate functional improvement in UE for  progression to next phase of protocol.  Goal status: met   3.  Pt will report at least 2 pt reduction on NPRS scale for pain in order to demonstrate functional improvement with household activity, self care, and ADL.   Goal status: met     LONG TERM GOALS: Target date: 08/25/2024    Pt  will become independent with final HEP in order to demonstrate synthesis of PT education.  Goal status: INITIAL   2.  Pt will be able to reach [facility] and carry/hold >25lbs in order to demonstrate functional improvement in L UE strength for return to PLOF and exercise.   Goal status: INITIAL   3.  Pt will be able to demonstrate full [place] AROM of the L shoulder in order to demonstrate functional improvement in UE function for self-care and house hold duties.   Goal status: PROGRESSING  7/16: Making excellent progress in overhead AROM. See above.   4.  Pt will test to within 80% HHD of R UE in order to progress to plyo loading of shoulder.    Goal status: INITIAL     PLAN: PT FREQUENCY: 1-2x/week  PT DURATION: 12 weeks  PLANNED INTERVENTIONS: Therapeutic exercises, Therapeutic activity, Neuromuscular re-education, Patient/Family education, Joint mobilization, Dry Needling, Spinal mobilization, Cryotherapy, Moist heat, Taping, and Manual therapy, Re-evaluation  PLAN FOR NEXT SESSION: AROM in all planes per protocol tolerance. Progress strengthening per protocol.          2:40 PM, 08/01/24 Peggye JAYSON Linear, PT, DPT Physical Therapist - [facility]  Outpatient Physical Therapy- Main Campus [phone]

## 2024-08-01 NOTE — Telephone Encounter (Signed)
Pt contacted via telephone and author left voice mail informing of missed appointment and informed pt of future PT appointment date and time.   Christopher Byrd PT, DPT   

## 2024-08-02 ENCOUNTER — Encounter (HOSPITAL_BASED_OUTPATIENT_CLINIC_OR_DEPARTMENT_OTHER): Admitting: Orthopaedic Surgery

## 2024-10-30 ENCOUNTER — Encounter: Payer: Self-pay | Admitting: Radiology
